# Patient Record
Sex: Male | Born: 1950 | Race: White | Hispanic: No
Health system: Southern US, Community
[De-identification: ages and names within clinical notes are randomized; demographics above are authoritative.]

---

## 2018-11-26 ENCOUNTER — Inpatient Hospital Stay
Admission: AD | Admit: 2018-11-26 | Discharge: 2018-12-29 | Disposition: A | Discharge status: Skilled Nursing Facility | Payer: Medicare HMO | Source: Other Acute Inpatient Hospital | Attending: Internal Medicine | Admitting: Internal Medicine

## 2018-11-26 DIAGNOSIS — K819 Cholecystitis, unspecified: Secondary | ICD-10-CM

## 2018-11-26 DIAGNOSIS — J9690 Respiratory failure, unspecified, unspecified whether with hypoxia or hypercapnia: Secondary | ICD-10-CM

## 2018-11-27 LAB — CBC WITH DIFFERENTIAL/PLATELET
Abs Immature Granulocytes: 0.03 10*3/uL (ref 0.00–0.07)
Basophils Absolute: 0 10*3/uL (ref 0.0–0.1)
Basophils Relative: 0 %
Eosinophils Absolute: 0.3 10*3/uL (ref 0.0–0.5)
Eosinophils Relative: 5 %
HCT: 32.9 % — ABNORMAL LOW (ref 39.0–52.0)
Hemoglobin: 10.1 g/dL — ABNORMAL LOW (ref 13.0–17.0)
Immature Granulocytes: 0 %
Lymphocytes Relative: 13 %
Lymphs Abs: 0.9 10*3/uL (ref 0.7–4.0)
MCH: 27.6 pg (ref 26.0–34.0)
MCHC: 30.7 g/dL (ref 30.0–36.0)
MCV: 89.9 fL (ref 80.0–100.0)
Monocytes Absolute: 0.7 10*3/uL (ref 0.1–1.0)
Monocytes Relative: 10 %
Neutro Abs: 5.1 10*3/uL (ref 1.7–7.7)
Neutrophils Relative %: 72 %
Platelets: 352 10*3/uL (ref 150–400)
RBC: 3.66 MIL/uL — ABNORMAL LOW (ref 4.22–5.81)
RDW: 16.7 % — ABNORMAL HIGH (ref 11.5–15.5)
WBC: 7 10*3/uL (ref 4.0–10.5)
nRBC: 0 % (ref 0.0–0.2)

## 2018-11-27 LAB — COMPREHENSIVE METABOLIC PANEL
ALT: 29 U/L (ref 0–44)
AST: 31 U/L (ref 15–41)
Albumin: 2.7 g/dL — ABNORMAL LOW (ref 3.5–5.0)
Alkaline Phosphatase: 136 U/L — ABNORMAL HIGH (ref 38–126)
Anion gap: 14 (ref 5–15)
BUN: 24 mg/dL — ABNORMAL HIGH (ref 8–23)
CO2: 19 mmol/L — ABNORMAL LOW (ref 22–32)
Calcium: 9.7 mg/dL (ref 8.9–10.3)
Chloride: 104 mmol/L (ref 98–111)
Creatinine, Ser: 1.95 mg/dL — ABNORMAL HIGH (ref 0.61–1.24)
GFR calc Af Amer: 40 mL/min — ABNORMAL LOW (ref >60)
GFR calc non Af Amer: 35 mL/min — ABNORMAL LOW (ref >60)
Glucose, Bld: 122 mg/dL — ABNORMAL HIGH (ref 70–99)
Potassium: 3.8 mmol/L (ref 3.5–5.1)
Sodium: 137 mmol/L (ref 135–145)
Total Bilirubin: 0.7 mg/dL (ref 0.3–1.2)
Total Protein: 7.4 g/dL (ref 6.5–8.1)

## 2018-11-29 LAB — BASIC METABOLIC PANEL
Anion gap: 13 (ref 5–15)
BUN: 36 mg/dL — ABNORMAL HIGH (ref 8–23)
CO2: 17 mmol/L — ABNORMAL LOW (ref 22–32)
Calcium: 9.9 mg/dL (ref 8.9–10.3)
Chloride: 104 mmol/L (ref 98–111)
Creatinine, Ser: 2.26 mg/dL — ABNORMAL HIGH (ref 0.61–1.24)
GFR calc Af Amer: 34 mL/min — ABNORMAL LOW (ref >60)
GFR calc non Af Amer: 29 mL/min — ABNORMAL LOW (ref >60)
Glucose, Bld: 122 mg/dL — ABNORMAL HIGH (ref 70–99)
Potassium: 4.4 mmol/L (ref 3.5–5.1)
Sodium: 134 mmol/L — ABNORMAL LOW (ref 135–145)

## 2018-11-30 LAB — BASIC METABOLIC PANEL
Anion gap: 9 (ref 5–15)
BUN: 32 mg/dL — ABNORMAL HIGH (ref 8–23)
CO2: 20 mmol/L — ABNORMAL LOW (ref 22–32)
Calcium: 9.5 mg/dL (ref 8.9–10.3)
Chloride: 107 mmol/L (ref 98–111)
Creatinine, Ser: 1.91 mg/dL — ABNORMAL HIGH (ref 0.61–1.24)
GFR calc Af Amer: 41 mL/min — ABNORMAL LOW (ref >60)
GFR calc non Af Amer: 36 mL/min — ABNORMAL LOW (ref >60)
Glucose, Bld: 116 mg/dL — ABNORMAL HIGH (ref 70–99)
Potassium: 4.1 mmol/L (ref 3.5–5.1)
Sodium: 136 mmol/L (ref 135–145)

## 2018-12-01 LAB — BASIC METABOLIC PANEL
Anion gap: 10 (ref 5–15)
BUN: 35 mg/dL — ABNORMAL HIGH (ref 8–23)
CO2: 18 mmol/L — ABNORMAL LOW (ref 22–32)
Calcium: 9.5 mg/dL (ref 8.9–10.3)
Chloride: 108 mmol/L (ref 98–111)
Creatinine, Ser: 1.71 mg/dL — ABNORMAL HIGH (ref 0.61–1.24)
GFR calc Af Amer: 47 mL/min — ABNORMAL LOW (ref >60)
GFR calc non Af Amer: 41 mL/min — ABNORMAL LOW (ref >60)
Glucose, Bld: 120 mg/dL — ABNORMAL HIGH (ref 70–99)
Potassium: 4.1 mmol/L (ref 3.5–5.1)
Sodium: 136 mmol/L (ref 135–145)

## 2018-12-02 LAB — BASIC METABOLIC PANEL
Anion gap: 12 (ref 5–15)
BUN: 27 mg/dL — ABNORMAL HIGH (ref 8–23)
CO2: 17 mmol/L — ABNORMAL LOW (ref 22–32)
Calcium: 9.7 mg/dL (ref 8.9–10.3)
Chloride: 105 mmol/L (ref 98–111)
Creatinine, Ser: 1.55 mg/dL — ABNORMAL HIGH (ref 0.61–1.24)
GFR calc Af Amer: 53 mL/min — ABNORMAL LOW (ref >60)
GFR calc non Af Amer: 46 mL/min — ABNORMAL LOW (ref >60)
Glucose, Bld: 129 mg/dL — ABNORMAL HIGH (ref 70–99)
Potassium: 5 mmol/L (ref 3.5–5.1)
Sodium: 134 mmol/L — ABNORMAL LOW (ref 135–145)

## 2018-12-04 LAB — CBC
HCT: 32.4 % — ABNORMAL LOW (ref 39.0–52.0)
Hemoglobin: 9.9 g/dL — ABNORMAL LOW (ref 13.0–17.0)
MCH: 26.6 pg (ref 26.0–34.0)
MCHC: 30.6 g/dL (ref 30.0–36.0)
MCV: 87.1 fL (ref 80.0–100.0)
Platelets: 351 10*3/uL (ref 150–400)
RBC: 3.72 MIL/uL — ABNORMAL LOW (ref 4.22–5.81)
RDW: 17 % — ABNORMAL HIGH (ref 11.5–15.5)
WBC: 10.5 10*3/uL (ref 4.0–10.5)
nRBC: 0 % (ref 0.0–0.2)

## 2018-12-04 LAB — BASIC METABOLIC PANEL
Anion gap: 10 (ref 5–15)
BUN: 21 mg/dL (ref 8–23)
CO2: 21 mmol/L — ABNORMAL LOW (ref 22–32)
Calcium: 9.3 mg/dL (ref 8.9–10.3)
Chloride: 100 mmol/L (ref 98–111)
Creatinine, Ser: 1.43 mg/dL — ABNORMAL HIGH (ref 0.61–1.24)
GFR calc Af Amer: 59 mL/min — ABNORMAL LOW (ref >60)
GFR calc non Af Amer: 51 mL/min — ABNORMAL LOW (ref >60)
Glucose, Bld: 153 mg/dL — ABNORMAL HIGH (ref 70–99)
Potassium: 3.8 mmol/L (ref 3.5–5.1)
Sodium: 131 mmol/L — ABNORMAL LOW (ref 135–145)

## 2018-12-05 ENCOUNTER — Other Ambulatory Visit (HOSPITAL_COMMUNITY): Payer: Medicare HMO

## 2018-12-05 MED ORDER — IOHEXOL 300 MG/ML  SOLN
125.0000 mL | Freq: Once | INTRAMUSCULAR | Status: AC | PRN
  Administered 2018-12-05: 125 mL via INTRAVENOUS

## 2018-12-07 ENCOUNTER — Other Ambulatory Visit (HOSPITAL_COMMUNITY): Payer: Medicare HMO

## 2018-12-07 MED ORDER — MORPHINE SULFATE (PF) 4 MG/ML IV SOLN
3.0000 mg | Freq: Once | INTRAVENOUS | Status: AC
  Administered 2018-12-07: 3 mg via INTRAVENOUS

## 2018-12-07 MED ORDER — MORPHINE SULFATE (PF) 4 MG/ML IV SOLN
INTRAVENOUS | Status: AC
  Filled 2018-12-07: qty 1

## 2018-12-07 MED ORDER — MORPHINE BOLUS VIA INFUSION: 3.0000 mg | Freq: Once | INTRAVENOUS | Status: DC

## 2018-12-08 ENCOUNTER — Other Ambulatory Visit (HOSPITAL_COMMUNITY): Payer: Medicare HMO

## 2018-12-08 LAB — COMPREHENSIVE METABOLIC PANEL
ALT: 15 U/L (ref 0–44)
AST: 16 U/L (ref 15–41)
Albumin: 1.9 g/dL — ABNORMAL LOW (ref 3.5–5.0)
Alkaline Phosphatase: 150 U/L — ABNORMAL HIGH (ref 38–126)
Anion gap: 19 — ABNORMAL HIGH (ref 5–15)
BUN: 56 mg/dL — ABNORMAL HIGH (ref 8–23)
CO2: 17 mmol/L — ABNORMAL LOW (ref 22–32)
Calcium: 9.3 mg/dL (ref 8.9–10.3)
Chloride: 95 mmol/L — ABNORMAL LOW (ref 98–111)
Creatinine, Ser: 3.84 mg/dL — ABNORMAL HIGH (ref 0.61–1.24)
GFR calc Af Amer: 18 mL/min — ABNORMAL LOW (ref >60)
GFR calc non Af Amer: 15 mL/min — ABNORMAL LOW (ref >60)
Glucose, Bld: 167 mg/dL — ABNORMAL HIGH (ref 70–99)
Potassium: 4.5 mmol/L (ref 3.5–5.1)
Sodium: 131 mmol/L — ABNORMAL LOW (ref 135–145)
Total Bilirubin: 0.9 mg/dL (ref 0.3–1.2)
Total Protein: 7.6 g/dL (ref 6.5–8.1)

## 2018-12-08 LAB — CBC
HCT: 31.9 % — ABNORMAL LOW (ref 39.0–52.0)
Hemoglobin: 9.9 g/dL — ABNORMAL LOW (ref 13.0–17.0)
MCH: 26.6 pg (ref 26.0–34.0)
MCHC: 31 g/dL (ref 30.0–36.0)
MCV: 85.8 fL (ref 80.0–100.0)
Platelets: 463 10*3/uL — ABNORMAL HIGH (ref 150–400)
RBC: 3.72 MIL/uL — ABNORMAL LOW (ref 4.22–5.81)
RDW: 16.7 % — ABNORMAL HIGH (ref 11.5–15.5)
WBC: 14.3 10*3/uL — ABNORMAL HIGH (ref 4.0–10.5)
nRBC: 0 % (ref 0.0–0.2)

## 2018-12-08 LAB — CK TOTAL AND CKMB (NOT AT ARMC)
CK, MB: 0.8 ng/mL (ref 0.5–5.0)
Relative Index: INVALID (ref 0.0–2.5)
Total CK: 26 U/L — ABNORMAL LOW (ref 49–397)

## 2018-12-08 LAB — TROPONIN I (HIGH SENSITIVITY): Troponin I (High Sensitivity): 18 ng/L — ABNORMAL HIGH (ref <18)

## 2018-12-08 LAB — TYPE AND SCREEN
ABO/RH(D): O POS
Antibody Screen: NEGATIVE

## 2018-12-08 LAB — PHOSPHORUS: Phosphorus: 6.3 mg/dL — ABNORMAL HIGH (ref 2.5–4.6)

## 2018-12-08 LAB — T4, FREE: Free T4: 0.97 ng/dL (ref 0.61–1.12)

## 2018-12-08 LAB — MAGNESIUM: Magnesium: 2 mg/dL (ref 1.7–2.4)

## 2018-12-08 LAB — TSH: TSH: 14.703 u[IU]/mL — ABNORMAL HIGH (ref 0.350–4.500)

## 2018-12-08 LAB — ABO/RH: ABO/RH(D): O POS

## 2018-12-08 LAB — BRAIN NATRIURETIC PEPTIDE: B Natriuretic Peptide: 113.7 pg/mL — ABNORMAL HIGH (ref 0.0–100.0)

## 2018-12-08 NOTE — Consult Note (Signed)
Chief Complaint: Patient was seen in consultation today for cholecystitis/cholecystostomy tube placement.  Referring Physician(s): Merton Border  Supervising Physician: Aletta Edouard  Patient Status: [facility]- inpt  History of Present Illness: Brent Newman is a 68 y.o. male with a past medical history of COPD, GERD, CKD stage III, recurrent diverticulitis with bowel perforation and abscess formation s/p exploratory laparotomy/washout/right hemicolectomy 09/2018,  hypothyroidism, anemia of chronic disease, chronic pain syndrome, morbid obesity, and tobacco use disorder. He presented to [facility] ED in 09/2018 (unable to find exact date in records) with complaint of abdominal pain and dyspnea. He was transferred/admitted to [facility] 10/06/2018 for further management. Imaging scans revealed free intraperitoneal air/multiple fluid collections. He was taken to OR for exploratory laparotomy and was found to have a large colon perforation with frank stool throughout his abdomen, he also underwent washout/right hemicolectomy at that time. 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 He was extubated 10/20/2018. He was transferred to [facility] on 11/26/2018. On 12/05/2018, patient noted to have tmax at 100.1, no complaints. CT abdomen/pelvis and RUQ [place] were done which revealed a distended gallbladder with wall thickening, sludge, and pericholecystic stranding/cholelithiasis. HIDA 12/07/2018 revealed findings consistent with acute cholecystitis. General surgery was consulted who states patient is not a surgical candidate given PMH, and recommended IR consultation for possible cholecystostomy tube placement.  IR consulted by Dr. Laren Everts for possible image-guided  percutaneous cholecystostomy tube placement. Patient awake and alert laying in bed. Complains of abdominal pain, stable since admission. Denies fever, chills, chest pain, dyspnea, or headache.   No past medical history on file.   Allergies: Patient has no allergy information on record.  Medications: Prior to Admission medications   Not on File     No family history on file.  Social History   Socioeconomic History   Marital status: Divorced    Spouse name: Not on file   Number of children: Not on file   Years of education: Not on file   Highest education level: Not on file  Occupational History   Not on file  Social Needs   Financial resource strain: Not on file   Food insecurity    Worry: Not on file    Inability: Not on file   Transportation needs    Medical: Not on file    Non-medical: Not on file  Tobacco Use   Smoking status: Not on file  Substance and Sexual Activity   Alcohol use: Not on file   Drug use: Not on file   Sexual activity: Not on file  Lifestyle   Physical activity    Days per week: Not on file    Minutes per session: Not on file   Stress: Not on file  Relationships   Social connections    Talks on phone: Not on file    Gets together: Not on file    Attends religious service: Not on file    Active member of club or organization: Not on file    Attends meetings of clubs or organizations: Not on file    Relationship status: Not on file  Other Topics Concern   Not on file  Social History Narrative   Not on file     Review of Systems: A 12 point ROS discussed and pertinent positives are indicated in  the HPI above.  All other systems are negative.  Review of Systems  Constitutional: Negative for chills and fever.  Respiratory: Negative for shortness of breath and wheezing.   Cardiovascular: Negative for chest pain and palpitations.  Gastrointestinal: Positive for abdominal pain.  Neurological: Negative for headaches.    Psychiatric/Behavioral: Negative for behavioral problems and confusion.    Vital Signs: There were no vitals taken for this visit.  Physical Exam Vitals signs and nursing note reviewed.  Constitutional:      General: He is not in acute distress.    Appearance: Normal appearance.  Cardiovascular:     Rate and Rhythm: Regular rhythm. Tachycardia present.     Heart sounds: Normal heart sounds. No murmur.  Pulmonary:     Effort: Pulmonary effort is normal. No respiratory distress.     Breath sounds: Normal breath sounds. No wheezing.  Skin:    General: Skin is warm and dry.  Neurological:     Mental Status: He is alert and oriented to person, place, and time.      MD Evaluation Airway: WNL Heart: WNL Abdomen: WNL Chest/ Lungs: WNL ASA  Classification: 3 Mallampati/Airway Score: Two   Imaging: Ct Abdomen Pelvis W Contrast  Addendum Date: 12/06/2018   ADDENDUM REPORT: 12/06/2018 15:28 ADDENDUM: Additional body imaging review requested. Acute sigmoid diverticulitis. Associated microperforation with tiny foci of pericolonic gas (series 3/image 70). Acute cholecystitis. Tiny layering gallstones (series 3/image 34). Additional tiny gallstone in the gallbladder neck (coronal image 51). No intrahepatic or extrahepatic ductal dilatation. No choledocholithiasis is seen (coronal image 48). Status post right hemicolectomy. Fluid/gas immediately adjacent to the suture line in the right mid abdomen (coronal images 21 and 23), raising concern for anastomotic breakdown. Scattered fluid and gas collections in the anterior/mid abdomen, worrisome for small abscesses given the clinical history. Dominant collection beneath the right mid anterior abdominal wall measures at least 3.0 x 7.6 x 9.8 cm (series 3/image 41; coronal image 38) and is adjacent to the gallbladder, possibly accounting for the right upper quadrant infection/inflammation. Additional collections include: --Fluid/gas beneath the  anterior abdominal wall (series 3/image 35) --2.6 cm fluid/gas collection beneath the midline anterior abdominal wall adjacent to the ileostomy (series 3/image 59) --2.0 cm fluid and gas collection is adjacent to small bowel in the anterior mid/lower abdomen (series 3/image 69) Surgical consultation is suggested. Electronically Signed   By: Charline Bills M.D.   On: 12/06/2018 15:28   Result Date: 12/06/2018 CLINICAL DATA:  Abdominal pain. Fever of unknown origin. EXAM: CT ABDOMEN AND PELVIS WITH CONTRAST TECHNIQUE: Multidetector CT imaging of the abdomen and pelvis was performed using the standard protocol following bolus administration of intravenous contrast. CONTRAST:  OMNIPAQUE IOHEXOL 300 MG/ML  SOLN COMPARISON:  None. FINDINGS: Lower chest: Right pleural thickening with right lower lobe atelectasis. There is lipomatous hypertrophy of the intra-atrial septum. Hepatobiliary: Gallbladder is distended with wall thickening and pericholecystic stranding. No calcified gallstone. No biliary dilatation. Mild hepatic steatosis without focal hepatic lesion. Pancreas: No ductal dilatation or inflammation. Spleen: Normal in size without focal abnormality. Adrenals/Urinary Tract: No adrenal nodule. Bilateral nonobstructing renal calculi. No hydronephrosis. Slight perinephric edema about the lower right kidney. Parapelvic cyst in the central upper left kidney. Absent excretion on delayed phase imaging consistent suggests renal dysfunction. Urinary bladder near completely empty. Layering hyperdensities in the dependent bladder and right urinary trigone suspicious for bladder stones. Stomach/Bowel: Bowel evaluation is limited in the absence of enteric contrast. There are right and left  lower quadrant enterostomies. Detailed bowel anatomy is not well-defined. Presumed prior right hemicolectomy. Stapled off transverse colon with diverticulosis distally. Area of colonic wall thickening and pericolonic edema in the  sigmoid in the region of multiple colonic diverticula series 3, image 73. Vascular/Lymphatic: Aortic atherosclerosis. No aneurysm. No abdominopelvic adenopathy. Reproductive: Prostate is unremarkable. Other: Postsurgical changes the anterior abdominal wall with multiple subcutaneous defects extending to the anterior peritoneum. Thin fluid collection with mild peripheral enhancement and focus of internal air in the right lower quadrant subjacent to the right abdominal wall measures approximately 5.3 x 10 mm, series 3, image 49. Air-fluid tract in the right upper quadrant is irregular in shape making measurements difficult, however tracks from images 35-42, measuring up to 5.6 cm in dimension. This approaches the transverse colon staple line. Musculoskeletal: There are no acute or suspicious osseous abnormalities. IMPRESSION: 1. Distended gallbladder with wall thickening and pericholecystic stranding, suspicious for acute cholecystitis. Consider right upper quadrant ultrasound for further evaluation. 2. Colonic wall thickening and mild pericolonic edema about the sigmoid colon in the region of multiple diverticula, may represent colitis or diverticulitis. 3. Recent postsurgical change with open anterior abdominal wall and bilateral lower quadrant enterostomies. Two thin irregularly-shaped separate fluid collections in the right abdomen as described, sterility indeterminate by imaging. Recommend correlation with surgical history. 4. Bilateral nonobstructing renal calculi. Absent renal excretion on delayed phase imaging suggesting underlying renal dysfunction. Aortic Atherosclerosis (ICD10-I70.0). Electronically Signed: By: Narda RutherfordMelanie  Sanford M.D. On: 12/05/2018 03:47   Nm Hepato W/eject Fract  Result Date: 12/07/2018 CLINICAL DATA:  Abdominal pain and fever.  Cholecystitis. EXAM: NUCLEAR MEDICINE HEPATOBILIARY IMAGING TECHNIQUE: Sequential images of the abdomen were obtained out to 60 minutes following intravenous  administration of radiopharmaceutical. 3 mg morphine was administered intravenously, and imaging was continued for another 50 minutes. RADIOPHARMACEUTICALS:  5.2 mCi Tc-7256m  Choletec IV COMPARISON:  None. FINDINGS: Prompt uptake and biliary excretion of activity by the liver is seen. Biliary activity passes into small bowel, consistent with patent common bile duct. No gallbladder activity is seen in either before or following administration of intravenous morphine, consistent with cystic duct obstruction. IMPRESSION: Absence of gallbladder activity demonstrates cystic duct obstruction, and is consistent with acute cholecystitis. No evidence of biliary obstruction. Electronically Signed   By: Danae OrleansJohn A Stahl M.D.   On: 12/07/2018 18:39   Dg Chest Port 1 View  Result Date: 12/08/2018 CLINICAL DATA:  Respiratory failure, shortness of breath EXAM: PORTABLE CHEST 1 VIEW COMPARISON:  None. FINDINGS: Cardiomegaly. There may be small, layering pleural effusions. The visualized skeletal structures are unremarkable. IMPRESSION: 1.  Cardiomegaly. 2. There may be small, layering pleural effusions. PA and lateral radiographs may be helpful to further evaluate. Electronically Signed   By: Lauralyn PrimesAlex  Bibbey M.D.   On: 12/08/2018 08:43   [place] Abdomen Limited Ruq  Result Date: 12/05/2018 CLINICAL DATA:  68 year old male with abdominal pain and fever. EXAM: ULTRASOUND ABDOMEN LIMITED RIGHT UPPER QUADRANT COMPARISON:  None. FINDINGS: Gallbladder: The gallbladder is distended and filled with sludge and multiple small stones. Mild gallbladder wall thickening is noted. No definite sonographic Murphy sign or pericholecystic fluid noted. Common bile duct: Diameter: 2.3 mm. No evidence of intrahepatic or extrahepatic biliary dilatation. Liver: No focal lesion identified. Within normal limits in parenchymal echogenicity. Portal vein is patent on color Doppler imaging with normal direction of blood flow towards the liver. Other: None.  IMPRESSION: 1. Cholelithiasis and gallbladder sludge with mild gallbladder wall thickening, suspicious for acute cholecystitis. 2. Unremarkable liver.  No biliary dilatation. Electronically Signed   By: Harmon Pier M.D.   On: 12/05/2018 20:01    Labs:  CBC: Recent Labs    11/27/18 0555 12/04/18 0527 12/08/18 0750  WBC 7.0 10.5 14.3*  HGB 10.1* 9.9* 9.9*  HCT 32.9* 32.4* 31.9*  PLT 352 351 463*    COAGS: No results for input(s): INR, APTT in the last 8760 hours.  BMP: Recent Labs    12/01/18 0536 12/02/18 0646 12/04/18 0527 12/08/18 0750  NA 136 134* 131* 131*  K 4.1 5.0 3.8 4.5  CL 108 105 100 95*  CO2 18* 17* 21* 17*  GLUCOSE 120* 129* 153* 167*  BUN 35* 27* 21 56*  CALCIUM 9.5 9.7 9.3 9.3  CREATININE 1.71* 1.55* 1.43* 3.84*  GFRNONAA 41* 46* 51* 15*  GFRAA 47* 53* 59* 18*    LIVER FUNCTION TESTS: Recent Labs    11/27/18 0555 12/08/18 0750  BILITOT 0.7 0.9  AST 31 16  ALT 29 15  ALKPHOS 136* 150*  PROT 7.4 7.6  ALBUMIN 2.7* 1.9*     Assessment and Plan:  Cholecystitis. Plan for image-guided percutaneous cholecystostomy tube placement tentatively for tomorrow 12/09/2018 in IR. Patient will be NPO at midnight. Afebrile. He does not take blood thinners. INR pending for 0500 tomorrow.  Risks and benefits were discussed with the patient including, but not limited to, bleeding, infection, gallbladder perforation, bile leak, sepsis or even death. All of the patient's questions were answered, patient is agreeable to proceed. Consent signed and in IR control room.   Thank you for this interesting consult.  I greatly enjoyed meeting Brent Newman and look forward to participating in their care.  A copy of this report was sent to the requesting provider on this date.  Electronically Signed: Elwin Mocha, PA-C 12/08/2018, 11:25 AM   I spent a total of 40 Minutes in face to face in clinical consultation, greater than 50% of which was  counseling/coordinating care for cholecystitis/cholecystostomy tube placement.

## 2018-12-08 NOTE — Consult Note (Signed)
Referring Physician: Dr. Priya/Dr. Lovett Sox Brent Newman is an 68 y.o. male.                       Chief Complaint: Palpitation  HPI: 68 years old male with PMH of respiratory failure, COPD, GERD, Hypothyroidism, Morbid obesity, CKD 3, anemia of chronic disease, tobacco use disorder and s/p perforation of bowel with abscess had right hemi-colectomy with chronic open abdominal wound with wound vac has heart rate in 190's for 1 hour. Patient c/o chest discomfort. Carotid massage did not change the rhythm. IV metoprolol 2.5 mg. IV x 2 improved heart rate from SVT to sinus rhythm 90's/min.   Past medical history : Positive type 2 DM, No HTN, + tobacco use disorder, + COPD, + hypothyroidism, CKD, III.   PSH: Appendectomy, Bilateral wrist ganglion cyst excision, Hemorroidectomy, Planter wart excision, Bilateral shoulder rotator cuff repair Stomach ulcer surgery and Tonsillectomy. Right hemicolectomy with open wound and wound vac in 09/2018.Marland Kitchen   The histories are not reviewed yet. Please review them in the "History" navigator section and refresh this SmartLink.  Family history : HTN to mother, Arthritis to mother and stroke in father.  Social History:  Divorced, every day smoker and 1 can of beer intake per week.  Allergies: Gabapentin  No medications prior to admission.    No results found for this or any previous visit (from the past 48 hour(s)). Nm Hepato W/eject Fract  Result Date: 12/07/2018 CLINICAL DATA:  Abdominal pain and fever.  Cholecystitis. EXAM: NUCLEAR MEDICINE HEPATOBILIARY IMAGING TECHNIQUE: Sequential images of the abdomen were obtained out to 60 minutes following intravenous administration of radiopharmaceutical. 3 mg morphine was administered intravenously, and imaging was continued for another 50 minutes. RADIOPHARMACEUTICALS:  5.2 mCi Tc-49m  Choletec IV COMPARISON:  None. FINDINGS: Prompt uptake and biliary excretion of activity by the liver is seen. Biliary activity passes  into small bowel, consistent with patent common bile duct. No gallbladder activity is seen in either before or following administration of intravenous morphine, consistent with cystic duct obstruction. IMPRESSION: Absence of gallbladder activity demonstrates cystic duct obstruction, and is consistent with acute cholecystitis. No evidence of biliary obstruction. Electronically Signed   By: Danae Orleans M.D.   On: 12/07/2018 18:39    Review Of Systems Constitutional: Positive fever, chills, weight loss or gain. Eyes: No vision change, wears glasses. No discharge or pain. Ears: No hearing loss, No tinnitus. Respiratory: Positive asthma, Positive COPD, pneumonias, shortness of breath. No hemoptysis. Cardiovascular: Positive chest pain, palpitation, leg edema. Gastrointestinal: Positive nausea, vomiting, diarrhea, no constipation. No GI bleed. No hepatitis. Genitourinary: No dysuria, hematuria, kidney stone. No incontinance. Neurological: No headache, stroke, seizures.  Psychiatry: No psych facility admission for anxiety, depression, suicide. No detox. Skin: No rash. Musculoskeletal: Positive joint pain, fibromyalgia, neck pain, back pain. Lymphadenopathy: No lymphadenopathy. Hematology: Positive anemia. No easy bruising.  P: 190 R: 18, BP: 90/40. There were no vitals taken for this visit. There is no height or weight on file to calculate BMI. General appearance: alert, cooperative, appears stated age and moderate distress Head: Normocephalic, atraumatic. Eyes: Blue eyes, pale pink conjunctiva, corneas clear.  Neck: No adenopathy, no carotid bruit, no JVD, supple, symmetrical, trachea midline and thyroid not enlarged. Resp: Clear to auscultation bilaterally. Cardio: Tachycardic, Regular rate and rhythm, S1, S2 normal, II/VI systolic murmur, no click, rub or gallop GI: Soft, tender; bowel sounds normal; large mid-line abdominal wound. Extremities: Trace edema, cyanosis or clubbing. Skin: Warm  and dry.  Neurologic: Alert and oriented X 1, normal strength..  Assessment/Plan Supra ventricular tachycardia-(SVT) COPD Asthma CKD, III Anemia of chronic diseasse Type 2 DM H/O HTN Tobacco use disorder S/P right hemicolectomy with open abdominal wound and wound vac  IV metoprolol 2.5 mg. X 2 over 2 minutes.  EKG post cardioversion Blood work. IV fluids.  Time spent: Review of old records, Lab, x-rays, EKG, other cardiac tests, examination, discussion with patient, PA over 70 minutes.  Birdie Riddle, MD  12/08/2018, 8:40 AM

## 2018-12-09 ENCOUNTER — Other Ambulatory Visit (HOSPITAL_COMMUNITY): Payer: Medicare HMO

## 2018-12-09 ENCOUNTER — Encounter (HOSPITAL_COMMUNITY): Payer: Self-pay | Admitting: Interventional Radiology

## 2018-12-09 HISTORY — PX: IR PERC CHOLECYSTOSTOMY: IMG2326

## 2018-12-09 LAB — GRAM STAIN

## 2018-12-09 LAB — BASIC METABOLIC PANEL
Anion gap: 15 (ref 5–15)
BUN: 56 mg/dL — ABNORMAL HIGH (ref 8–23)
CO2: 16 mmol/L — ABNORMAL LOW (ref 22–32)
Calcium: 9 mg/dL (ref 8.9–10.3)
Chloride: 102 mmol/L (ref 98–111)
Creatinine, Ser: 3.61 mg/dL — ABNORMAL HIGH (ref 0.61–1.24)
GFR calc Af Amer: 19 mL/min — ABNORMAL LOW (ref >60)
GFR calc non Af Amer: 17 mL/min — ABNORMAL LOW (ref >60)
Glucose, Bld: 111 mg/dL — ABNORMAL HIGH (ref 70–99)
Potassium: 4.9 mmol/L (ref 3.5–5.1)
Sodium: 133 mmol/L — ABNORMAL LOW (ref 135–145)

## 2018-12-09 LAB — PROTIME-INR
INR: 1.5 — ABNORMAL HIGH (ref 0.8–1.2)
Prothrombin Time: 18.3 seconds — ABNORMAL HIGH (ref 11.4–15.2)

## 2018-12-09 LAB — SARS CORONAVIRUS 2 (TAT 6-24 HRS): SARS Coronavirus 2: NEGATIVE

## 2018-12-09 MED ORDER — MIDAZOLAM HCL 2 MG/2ML IJ SOLN
INTRAMUSCULAR | Status: AC | PRN
  Administered 2018-12-09: 1 mg via INTRAVENOUS

## 2018-12-09 MED ORDER — IOHEXOL 300 MG/ML  SOLN
50.0000 mL | Freq: Once | INTRAMUSCULAR | Status: AC | PRN
  Administered 2018-12-09: 10 mL

## 2018-12-09 MED ORDER — HYDROMORPHONE HCL 1 MG/ML IJ SOLN
INTRAMUSCULAR | Status: AC | PRN
  Administered 2018-12-09 (×2): 0.5 mg via INTRAVENOUS

## 2018-12-09 MED ORDER — HYDROMORPHONE HCL 1 MG/ML IJ SOLN
INTRAMUSCULAR | Status: AC
  Filled 2018-12-09: qty 1

## 2018-12-09 MED ORDER — MIDAZOLAM HCL 2 MG/2ML IJ SOLN
INTRAMUSCULAR | Status: AC
  Filled 2018-12-09: qty 2

## 2018-12-09 MED ORDER — FENTANYL CITRATE (PF) 100 MCG/2ML IJ SOLN
INTRAMUSCULAR | Status: AC | PRN
  Administered 2018-12-09: 50 ug via INTRAVENOUS

## 2018-12-09 MED ORDER — FENTANYL CITRATE (PF) 100 MCG/2ML IJ SOLN
INTRAMUSCULAR | Status: AC
  Filled 2018-12-09: qty 2

## 2018-12-09 MED ORDER — LIDOCAINE HCL 1 % IJ SOLN
INTRAMUSCULAR | Status: AC
  Filled 2018-12-09: qty 20

## 2018-12-09 NOTE — Procedures (Signed)
Interventional Radiology Procedure Note  Procedure: Successful placement of a 78F percutaneous cholecystostomy tube.   Complications: None  Estimated Blood Loss: None  Recommendations: - Cultures sent - Flush tube Q shift - Tube check/change in 8 weeks  Signed,  Criselda Peaches, MD

## 2018-12-10 LAB — COMPREHENSIVE METABOLIC PANEL
ALT: 12 U/L (ref 0–44)
AST: 17 U/L (ref 15–41)
Albumin: 1.7 g/dL — ABNORMAL LOW (ref 3.5–5.0)
Alkaline Phosphatase: 119 U/L (ref 38–126)
Anion gap: 14 (ref 5–15)
BUN: 58 mg/dL — ABNORMAL HIGH (ref 8–23)
CO2: 16 mmol/L — ABNORMAL LOW (ref 22–32)
Calcium: 9.1 mg/dL (ref 8.9–10.3)
Chloride: 108 mmol/L (ref 98–111)
Creatinine, Ser: 4.04 mg/dL — ABNORMAL HIGH (ref 0.61–1.24)
GFR calc Af Amer: 17 mL/min — ABNORMAL LOW (ref >60)
GFR calc non Af Amer: 14 mL/min — ABNORMAL LOW (ref >60)
Glucose, Bld: 152 mg/dL — ABNORMAL HIGH (ref 70–99)
Potassium: 4 mmol/L (ref 3.5–5.1)
Sodium: 138 mmol/L (ref 135–145)
Total Bilirubin: 0.6 mg/dL (ref 0.3–1.2)
Total Protein: 7.1 g/dL (ref 6.5–8.1)

## 2018-12-10 LAB — CBC
HCT: 29.2 % — ABNORMAL LOW (ref 39.0–52.0)
Hemoglobin: 8.9 g/dL — ABNORMAL LOW (ref 13.0–17.0)
MCH: 26.3 pg (ref 26.0–34.0)
MCHC: 30.5 g/dL (ref 30.0–36.0)
MCV: 86.1 fL (ref 80.0–100.0)
Platelets: 461 10*3/uL — ABNORMAL HIGH (ref 150–400)
RBC: 3.39 MIL/uL — ABNORMAL LOW (ref 4.22–5.81)
RDW: 17 % — ABNORMAL HIGH (ref 11.5–15.5)
WBC: 10.4 10*3/uL (ref 4.0–10.5)
nRBC: 0 % (ref 0.0–0.2)

## 2018-12-11 NOTE — Progress Notes (Signed)
Referring Physician(s): Carron Curie  Supervising Physician: Simonne Come  Patient Status: [facility]- inpt  Chief Complaint: "Thirsty"  Subjective:  Acute cholecystitis s/p percutaneous cholecystostomy tube placement in IR 12/09/2018 by Dr. Archer Asa. Patient awake and alert laying in bed. States he is thirsty. Cholecystostomy tube site c/d/i.   Allergies: Patient has no allergy information on record.  Medications: Prior to Admission medications   Not on File     Vital Signs: BP 126/70    Pulse 91    Resp 17    SpO2 93%   Physical Exam Vitals signs and nursing note reviewed.  Constitutional:      General: He is not in acute distress.    Appearance: Normal appearance.  Pulmonary:     Effort: Pulmonary effort is normal. No respiratory distress.  Abdominal:     Comments: Cholecystostomy tube site without tenderness, erythema, drainage, or active bleeding; approximately 25 cc of dark brown bile in gravity bag.  Skin:    General: Skin is warm and dry.  Neurological:     Mental Status: He is alert.     Imaging: Nm Hepato W/eject Fract  Result Date: 12/07/2018 CLINICAL DATA:  Abdominal pain and fever.  Cholecystitis. EXAM: NUCLEAR MEDICINE HEPATOBILIARY IMAGING TECHNIQUE: Sequential images of the abdomen were obtained out to 60 minutes following intravenous administration of radiopharmaceutical. 3 mg morphine was administered intravenously, and imaging was continued for another 50 minutes. RADIOPHARMACEUTICALS:  5.2 mCi Tc-84m  Choletec IV COMPARISON:  None. FINDINGS: Prompt uptake and biliary excretion of activity by the liver is seen. Biliary activity passes into small bowel, consistent with patent common bile duct. No gallbladder activity is seen in either before or following administration of intravenous morphine, consistent with cystic duct obstruction. IMPRESSION: Absence of gallbladder activity demonstrates cystic duct obstruction, and is consistent with acute  cholecystitis. No evidence of biliary obstruction. Electronically Signed   By: Danae Orleans M.D.   On: 12/07/2018 18:39   Ir Perc Cholecystostomy  Result Date: 12/09/2018 INDICATION: 68 year old male with acute calculus cholecystitis. He is currently not an operative candidate and therefore presents for percutaneous cholecystostomy tube placement. EXAM: CHOLECYSTOSTOMY MEDICATIONS: Patient is currently admitted and receiving intravenous Zosyn. No additional antibiotic coverage administered. ANESTHESIA/SEDATION: Moderate (conscious) sedation was employed during this procedure. A total of Versed 1 mg and Fentanyl 50 mcg and 1 mg Dilaudid was administered intravenously. Moderate Sedation Time: 20 minutes. The patient's level of consciousness and vital signs were monitored continuously by radiology nursing throughout the procedure under my direct supervision. FLUOROSCOPY TIME:  Fluoroscopy Time: 1 minutes 6 seconds (9 mGy). COMPLICATIONS: None immediate. PROCEDURE: Informed written consent was obtained from the patient after a thorough discussion of the procedural risks, benefits and alternatives. All questions were addressed. Maximal Sterile Barrier Technique was utilized including caps, mask, sterile gowns, sterile gloves, sterile drape, hand hygiene and skin antiseptic. A timeout was performed prior to the initiation of the procedure. The right upper quadrant was interrogated with ultrasound. The distended gallbladder was successfully identified. The gallbladder lumen is filled with heterogeneously echogenic material and multiple echogenic foci some of which demonstrate ring down artifact. A suitable skin entry site was selected and marked. Local anesthesia was attained by infiltration with 1% lidocaine. A small dermatotomy was made. Under real-time sonographic guidance, a 21 gauge Accustick needle was advanced along a short transhepatic course and into the gallbladder lumen. The stylet was removed and a wire  was advanced into the gallbladder lumen. The needle was removed.  The Accustick sheath was advanced over the wire and into the gallbladder lumen. An injection of contrast material was performed confirming that the catheter is within the gallbladder. Aspiration was performed yielding approximately 100 cc of thick, dark brown bile and debris. The catheter was gently flushed and connected to gravity bag drainage. The catheter was then secured to the skin with 0 Prolene suture. A final image was obtained documenting the position of the drainage catheter for the medical record. IMPRESSION: Successful placement of a transhepatic percutaneous cholecystostomy tube for acute calculus cholecystitis. PLAN: 1. Return to interventional Radiology in 8 weeks for tube check/exchange. 2. Patient to follow with general surgery in the future to determine if he will be common operative candidate in the future. Signed, Criselda Peaches, MD, [facility] Electronically Signed   By: Jacqulynn Cadet M.D.   On: 12/09/2018 12:39   Dg Chest Port 1 View  Result Date: 12/08/2018 CLINICAL DATA:  Respiratory failure, shortness of breath EXAM: PORTABLE CHEST 1 VIEW COMPARISON:  None. FINDINGS: Cardiomegaly. There may be small, layering pleural effusions. The visualized skeletal structures are unremarkable. IMPRESSION: 1.  Cardiomegaly. 2. There may be small, layering pleural effusions. PA and lateral radiographs may be helpful to further evaluate. Electronically Signed   By: Eddie Candle M.D.   On: 12/08/2018 08:43    Labs:  CBC: Recent Labs    11/27/18 0555 12/04/18 0527 12/08/18 0750 12/10/18 0704  WBC 7.0 10.5 14.3* 10.4  HGB 10.1* 9.9* 9.9* 8.9*  HCT 32.9* 32.4* 31.9* 29.2*  PLT 352 351 463* 461*    COAGS: Recent Labs    12/09/18 0532  INR 1.5*    BMP: Recent Labs    12/04/18 0527 12/08/18 0750 12/09/18 0532 12/10/18 0704  NA 131* 131* 133*  138  K 3.8 4.5 4.9 4.0  CL 100 95* 102 108  CO2 21* 17* 16* 16*  GLUCOSE 153* 167* 111* 152*  BUN 21 56* 56* 58*  CALCIUM 9.3 9.3 9.0 9.1  CREATININE 1.43* 3.84* 3.61* 4.04*  GFRNONAA 51* 15* 17* 14*  GFRAA 59* 18* 19* 17*    LIVER FUNCTION TESTS: Recent Labs    11/27/18 0555 12/08/18 0750 12/10/18 0704  BILITOT 0.7 0.9 0.6  AST 31 16 17   ALT 29 15 12   ALKPHOS 136* 150* 119  PROT 7.4 7.6 7.1  ALBUMIN 2.7* 1.9* 1.7*    Assessment and Plan:  Acute cholecystitis s/p percutaneous cholecystostomy tube placement in IR 12/09/2018 by Dr. Laurence Ferrari. Cholecystostomy tube stable with approximately 25 cc dark brown bile in gravity bag. Continue current drain management. Plan for tube check/exchange 8 weeks after placement. Further plans per [facility]- appreciate and agree with management. IR to follow.   Electronically Signed: Earley Abide, PA-C 12/11/2018, 2:45 PM   I spent a total of 25 Minutes at the the patient's bedside AND on the patient's hospital floor or unit, greater than 50% of which was counseling/coordinating care for acute cholecystitis s/p cholecystostomy tube placement.

## 2018-12-12 LAB — BASIC METABOLIC PANEL
Anion gap: 14 (ref 5–15)
BUN: 50 mg/dL — ABNORMAL HIGH (ref 8–23)
CO2: 16 mmol/L — ABNORMAL LOW (ref 22–32)
Calcium: 9.4 mg/dL (ref 8.9–10.3)
Chloride: 110 mmol/L (ref 98–111)
Creatinine, Ser: 4.15 mg/dL — ABNORMAL HIGH (ref 0.61–1.24)
GFR calc Af Amer: 16 mL/min — ABNORMAL LOW (ref >60)
GFR calc non Af Amer: 14 mL/min — ABNORMAL LOW (ref >60)
Glucose, Bld: 87 mg/dL (ref 70–99)
Potassium: 3.5 mmol/L (ref 3.5–5.1)
Sodium: 140 mmol/L (ref 135–145)

## 2018-12-12 LAB — CULTURE, BODY FLUID W GRAM STAIN -BOTTLE

## 2018-12-14 LAB — VANCOMYCIN, RANDOM: Vancomycin Rm: 13

## 2018-12-16 LAB — CBC
HCT: 29.5 % — ABNORMAL LOW (ref 39.0–52.0)
Hemoglobin: 8.6 g/dL — ABNORMAL LOW (ref 13.0–17.0)
MCH: 26.1 pg (ref 26.0–34.0)
MCHC: 29.2 g/dL — ABNORMAL LOW (ref 30.0–36.0)
MCV: 89.4 fL (ref 80.0–100.0)
Platelets: 445 10*3/uL — ABNORMAL HIGH (ref 150–400)
RBC: 3.3 MIL/uL — ABNORMAL LOW (ref 4.22–5.81)
RDW: 17.7 % — ABNORMAL HIGH (ref 11.5–15.5)
WBC: 7.5 10*3/uL (ref 4.0–10.5)
nRBC: 0 % (ref 0.0–0.2)

## 2018-12-16 LAB — BASIC METABOLIC PANEL
Anion gap: 11 (ref 5–15)
BUN: 33 mg/dL — ABNORMAL HIGH (ref 8–23)
CO2: 16 mmol/L — ABNORMAL LOW (ref 22–32)
Calcium: 9.1 mg/dL (ref 8.9–10.3)
Chloride: 119 mmol/L — ABNORMAL HIGH (ref 98–111)
Creatinine, Ser: 3.09 mg/dL — ABNORMAL HIGH (ref 0.61–1.24)
GFR calc Af Amer: 23 mL/min — ABNORMAL LOW (ref >60)
GFR calc non Af Amer: 20 mL/min — ABNORMAL LOW (ref >60)
Glucose, Bld: 76 mg/dL (ref 70–99)
Potassium: 3.3 mmol/L — ABNORMAL LOW (ref 3.5–5.1)
Sodium: 146 mmol/L — ABNORMAL HIGH (ref 135–145)

## 2018-12-16 LAB — MAGNESIUM: Magnesium: 1.5 mg/dL — ABNORMAL LOW (ref 1.7–2.4)

## 2018-12-16 MED ORDER — Medication
100.00 | Status: DC
Start: 2018-11-26 — End: 2018-12-16

## 2018-12-16 MED ORDER — DAYHIST-1 PO
15.00 | ORAL | Status: DC
Start: 2018-11-26 — End: 2018-12-16

## 2018-12-16 MED ORDER — Medication
Status: DC
Start: ? — End: 2018-12-16

## 2018-12-16 MED ORDER — ACTIVE CATH MALE EXTERNAL MISC
4.00 | Status: DC
Start: 2018-11-26 — End: 2018-12-16

## 2018-12-16 MED ORDER — GENERIC EXTERNAL MEDICATION
0.08 | Status: DC
Start: ? — End: 2018-12-16

## 2018-12-16 MED ORDER — TGT EYE ALLERGY RELIEF 0.027-0.315 % OP SOLN
25.00 | OPHTHALMIC | Status: DC
Start: ? — End: 2018-12-16

## 2018-12-16 MED ORDER — PYRILAMINE TAN-PHENYLEPH TAN 30-5 MG/5ML PO SUSP
50.00 | ORAL | Status: DC
Start: 2018-11-26 — End: 2018-12-16

## 2018-12-16 MED ORDER — BAYER WOMENS 81-300 MG PO TABS
40.00 | ORAL_TABLET | ORAL | Status: DC
Start: 2018-11-26 — End: 2018-12-16

## 2018-12-16 MED ORDER — ONE-A-DAY WITHIN PO
30.00 | ORAL | Status: DC
Start: ? — End: 2018-12-16

## 2018-12-16 MED ORDER — OLANZAPINE-FLUOXETINE HCL 6-50 MG PO CAPS
3.00 | ORAL_CAPSULE | ORAL | Status: DC
Start: 2018-11-26 — End: 2018-12-16

## 2018-12-16 MED ORDER — SODIUM CHLORIDE 0.9 % IV SOLN
10.00 | INTRAVENOUS | Status: DC
Start: ? — End: 2018-12-16

## 2018-12-16 MED ORDER — NIGHT-TIME SLEEP AID PO
4.00 | ORAL | Status: DC
Start: 2018-11-26 — End: 2018-12-16

## 2018-12-16 NOTE — Progress Notes (Signed)
    Referring Physician(s): Hijazi  Supervising Physician: Markus Daft  Patient Status:  [facility]  Chief Complaint:  Cholecystitis   Subjective:  Perc chole drain placed 10/21 in IR Draining well OP bile Up in bed No complaints  Allergies: Patient has no allergy information on record.  Medications: Prior to Admission medications   Not on File     Vital Signs: BP 126/70   Pulse 91   Resp 17   SpO2 93%   Physical Exam Vitals signs reviewed.  Skin:    General: Skin is warm and dry.     Comments: Site is clean and dry NT no bleeding OP bile Flushing easily  Report Status 12/12/2018 FINAL  Organism ID, Bacteria ENTEROCOCCUS FAECIUM        Imaging: No results found.  Labs:  CBC: Recent Labs    12/04/18 0527 12/08/18 0750 12/10/18 0704 12/16/18 0619  WBC 10.5 14.3* 10.4 7.5  HGB 9.9* 9.9* 8.9* 8.6*  HCT 32.4* 31.9* 29.2* 29.5*  PLT 351 463* 461* 445*    COAGS: Recent Labs    12/09/18 0532  INR 1.5*    BMP: Recent Labs    12/09/18 0532 12/10/18 0704 12/12/18 0525 12/16/18 0619  NA 133* 138 140 146*  K 4.9 4.0 3.5 3.3*  CL 102 108 110 119*  CO2 16* 16* 16* 16*  GLUCOSE 111* 152* 87 76  BUN 56* 58* 50* 33*  CALCIUM 9.0 9.1 9.4 9.1  CREATININE 3.61* 4.04* 4.15* 3.09*  GFRNONAA 17* 14* 14* 20*  GFRAA 19* 17* 16* 23*    LIVER FUNCTION TESTS: Recent Labs    11/27/18 0555 12/08/18 0750 12/10/18 0704  BILITOT 0.7 0.9 0.6  AST 31 16 17   ALT 29 15 12   ALKPHOS 136* 150* 119  PROT 7.4 7.6 7.1  ALBUMIN 2.7* 1.9* 1.7*    Assessment and Plan:  Perc chole drain intact draining well Pt will need drain injection 8 weeks If home with drain: need flushed daily 5 cc sterile saline; need record OP daily Please let IR know if pt DCing with drain  Electronically Signed: Lavonia Drafts, PA-C 12/16/2018, 10:06 AM   I spent a total of 15 Minutes at the the patient's bedside AND on the patient's hospital floor or unit, greater than  50% of which was counseling/coordinating care for perc chole drain

## 2018-12-16 NOTE — Consult Note (Signed)
[facility] CONSULT NOTE    Date: 12/16/2018                  Patient Name:  Brent Newman  MRN: [id number]  DOB: Oct 13, 1950  Age / Sex: 68 y.o., male         PCP: Default, Provider, MD                 Service Requesting Consult: Hospitalist                 Reason for Consult: Acute renal failure            History of Present Illness: Patient is a 68 y.o. male with a PMHx of asthma, nicotine abuse, hypertension, BPH, morbid obesity, peripheral neuropathy, chronic pain syndrome, recurrent diverticulitis with colonic perforation status post right hemicolectomy and colostomy/ileostomy placement, cholecystectomy tube placement, chronic kidney disease stage III with acute kidney injury, who was admitted to [facility] 11/26/2018 for ongoing treatment of generalized debility, acute renal failure, malnutrition.  Patient had a very prolonged hospitalization from October 06, 2018 to November 26, 2018 at [facility].  As above he had history of diverticulitis with colonic perforation.  He underwent ileostomy placement with multiple abdominal washouts.  He also developed acute renal failure with a peak creatinine of 7 but did not require dialysis treatment.  Upon arrival here his creatinine was 1.43.  It rose to a peak of 4.15 but is now trending down and currently 3.09.   Medications: Outpatient medications: 0.9 normal saline, Humalog sliding scale insulin, cholestyramine 1 packet twice daily, Ensure 1 supplement at supper, Juven 1 packet twice daily, levothyroxine 0.5 mg daily, Lidoderm 1 patch daily, loperamide 4 mg 4 times daily, Lyrica 50 mg twice daily, melatonin 3 mg nightly, metoprolol 12.5 mg twice daily, mirtazapine 15 mg nightly, pantoprazole 40 mg twice daily, multivitamin 1 tablet daily, trazodone 50 mg nightly, vancomycin 1.5 g every 72 hours   Allergies: Gabapentin   Past Medical History: asthma, nicotine abuse, hypertension, BPH,  morbid obesity, peripheral neuropathy, chronic pain syndrome, recurrent diverticulitis with colonic perforation status post right hemicolectomy and colostomy/ileostomy placement, cholecystectomy tube placement, chronic kidney disease stage III with acute kidney injury,  Past Surgical History: Right hemicolectomy with ileostomy creation Multiple abdominal washouts Cholecystostomy tube placement  Family History: No family history of ESRD  Social History: Prior history of tobacco use.  Review of Systems: Review of Systems  Constitutional: Positive for malaise/fatigue. Negative for chills and fever.  HENT: Negative for congestion, hearing loss and tinnitus.   Eyes: Negative for blurred vision and double vision.  Respiratory: Negative for cough, hemoptysis and shortness of breath.   Cardiovascular: Negative for chest pain, palpitations and orthopnea.  Gastrointestinal: Positive for abdominal pain. Negative for heartburn, nausea and vomiting.  Genitourinary: Negative for dysuria and urgency.  Musculoskeletal: Positive for joint pain.  Skin: Negative for itching and rash.  Neurological: Positive for weakness. Negative for dizziness.  Endo/Heme/Allergies: Negative for polydipsia. Does not bruise/bleed easily.  Psychiatric/Behavioral: Negative for depression. The patient is not nervous/anxious.      Vital Signs: Temperature 97.3 pulse 90 respirations 20 blood pressure 130/74 Weight trends: There were no vitals filed for this visit.  Physical Exam: General: Chronically ill-appearing  Head: Normocephalic, atraumatic.  Eyes: Anicteric, EOMI  Nose: Mucous membranes moist, not inflammed, nonerythematous.  Throat: Oropharynx nonerythematous, no exudate appreciated.   Neck: Supple, trachea midline.  Lungs:  Normal respiratory effort. Clear  to auscultation BL without crackles or wheezes.  Heart: S1S2 no rubs  Abdomen:  Ileostomy noted, cholecystostomy tube noted  Extremities: Trace lower  extremity edema  Neurologic: A&O X3, Motor strength is 5/5 in the all 4 extremities  Skin: No visible rashes    Lab results: Basic Metabolic Panel: Recent Labs  Lab 12/10/18 0704 12/12/18 0525 12/16/18 0619  NA 138 140 146*  K 4.0 3.5 3.3*  CL 108 110 119*  CO2 16* 16* 16*  GLUCOSE 152* 87 76  BUN 58* 50* 33*  CREATININE 4.04* 4.15* 3.09*  CALCIUM 9.1 9.4 9.1  MG  --   --  1.5*    Liver Function Tests: Recent Labs  Lab 12/10/18 0704  AST 17  ALT 12  ALKPHOS 119  BILITOT 0.6  PROT 7.1  ALBUMIN 1.7*   No results for input(s): LIPASE, AMYLASE in the last 168 hours. No results for input(s): AMMONIA in the last 168 hours.  CBC: Recent Labs  Lab 12/10/18 0704 12/16/18 0619  WBC 10.4 7.5  HGB 8.9* 8.6*  HCT 29.2* 29.5*  MCV 86.1 89.4  PLT 461* 445*    Cardiac Enzymes: No results for input(s): CKTOTAL, CKMB, CKMBINDEX, TROPONINI in the last 168 hours.  BNP: Invalid input(s): POCBNP  CBG: No results for input(s): GLUCAP in the last 168 hours.  Microbiology: Results for orders placed or performed during the hospital encounter of 11/26/18  SARS CORONAVIRUS 2 (TAT 6-24 HRS) Nasopharyngeal Nasopharyngeal Swab     Status: None   Collection Time: 12/08/18 10:05 PM   Specimen: Nasopharyngeal Swab  Result Value Ref Range Status   SARS Coronavirus 2 NEGATIVE NEGATIVE Final    Comment: (NOTE) SARS-CoV-2 target nucleic acids are NOT DETECTED. The SARS-CoV-2 RNA is generally detectable in upper and lower respiratory specimens during the acute phase of infection. Negative results do not preclude SARS-CoV-2 infection, do not rule out co-infections with other pathogens, and should not be used as the sole basis for treatment or other patient management decisions. Negative results must be combined with clinical observations, patient history, and epidemiological information. The expected result is Negative. Fact Sheet for  Patients: SugarRoll.be Fact Sheet for Healthcare Providers: https://www.woods-mathews.com/ This test is not yet approved or cleared by the [place] FDA and  has been authorized for detection and/or diagnosis of SARS-CoV-2 by FDA under an Emergency Use Authorization (EUA). This EUA will remain  in effect (meaning this test can be used) for the duration of the COVID-19 declaration under Section 56 4(b)(1) of the Act, 21 U.S.C. section 360bbb-3(b)(1), unless the authorization is terminated or revoked sooner. Performed at [facility], [place] [address], [place], [place] [address]   Culture, body fluid-bottle     Status: Abnormal   Collection Time: 12/09/18 11:53 AM   Specimen: BILE  Result Value Ref Range Status   Specimen Description BILE  Final   Special Requests NONE  Final   Gram Stain   Final    IN BOTH AEROBIC AND ANAEROBIC BOTTLES GRAM POSITIVE COCCI Performed at [facility], [address] [place], [place] [address]    Culture ENTEROCOCCUS FAECIUM (A)  Final   Report Status 12/12/2018 FINAL  Final   Organism ID, Bacteria ENTEROCOCCUS FAECIUM  Final      Susceptibility   Enterococcus faecium - MIC*    AMPICILLIN >=32 RESISTANT Resistant     VANCOMYCIN 1 SENSITIVE Sensitive     GENTAMICIN SYNERGY SENSITIVE Sensitive     *  ENTEROCOCCUS FAECIUM  Gram stain     Status: None   Collection Time: 12/09/18 11:53 AM   Specimen: BILE  Result Value Ref Range Status   Specimen Description BILE  Final   Special Requests NONE  Final   Gram Stain   Final    RARE WBC PRESENT, PREDOMINANTLY MONONUCLEAR MODERATE GRAM POSITIVE COCCI IN PAIRS IN CHAINS Performed at [facility], [address], [place], [place] [address]    Report Status 12/09/2018 FINAL  Final    Coagulation Studies: No results for input(s): LABPROT, INR in the last 72 hours.  Urinalysis: No results for input(s): COLORURINE, LABSPEC, PHURINE, GLUCOSEU,  HGBUR, BILIRUBINUR, KETONESUR, PROTEINUR, UROBILINOGEN, NITRITE, LEUKOCYTESUR in the last 72 hours.  Invalid input(s): APPERANCEUR    Imaging:  No results found.   Assessment & Plan: Pt is a 68 y.o. male with a PMHx of asthma, nicotine abuse, hypertension, BPH, morbid obesity, peripheral neuropathy, chronic pain syndrome, recurrent diverticulitis with colonic perforation status post right hemicolectomy and colostomy/ileostomy placement, cholecystectomy tube placement, chronic kidney disease stage III with acute kidney injury, who was admitted to [facility]/2018 for ongoing treatment of generalized debility, acute renal failure, malnutrition.    1.  Acute renal failure/chronic kidney disease stage III a baseline EGFR 51.  Suspect acute renal failure related to concurrent illness and periods of dehydration as he has ileostomy in place.  Creatinine now trending down to 3.09.  Continue IV fluid hydration for now.  No indication for dialysis.  2.  Hypernatremia.  Serum sodium has risen to 146.  We will need to monitor this closely.  Repeat serum sodium on Friday.  3.  Metabolic acidosis.  Serum bicarbonate currently 16.  Likely related to ileostomy and GI output.  May need to consider adding sodium bicarbonate but hold off for now.  4.  Thanks for consultation.

## 2018-12-17 LAB — MAGNESIUM: Magnesium: 1.8 mg/dL (ref 1.7–2.4)

## 2018-12-17 LAB — POTASSIUM: Potassium: 2.9 mmol/L — ABNORMAL LOW (ref 3.5–5.1)

## 2018-12-18 LAB — BASIC METABOLIC PANEL
Anion gap: 12 (ref 5–15)
BUN: 26 mg/dL — ABNORMAL HIGH (ref 8–23)
CO2: 23 mmol/L (ref 22–32)
Calcium: 8.9 mg/dL (ref 8.9–10.3)
Chloride: 103 mmol/L (ref 98–111)
Creatinine, Ser: 2.49 mg/dL — ABNORMAL HIGH (ref 0.61–1.24)
GFR calc Af Amer: 30 mL/min — ABNORMAL LOW (ref >60)
GFR calc non Af Amer: 26 mL/min — ABNORMAL LOW (ref >60)
Glucose, Bld: 89 mg/dL (ref 70–99)
Potassium: 3.2 mmol/L — ABNORMAL LOW (ref 3.5–5.1)
Sodium: 138 mmol/L (ref 135–145)

## 2018-12-18 NOTE — Progress Notes (Signed)
[facility]  ROUNDING NOTE   Subjective:  Patient seen and evaluated at bedside today. Renal function has improved significantly. Creatinine down to 2.49. Potassium still a bit low at 3.2.   Objective:  Vital signs in last 24 hours:  Temperature 98.6 pulse 95 respirations 23 blood pressure 130/71  Physical Exam: General: No acute distress  Head: Normocephalic, atraumatic. Moist oral mucosal membranes  Eyes: Anicteric  Neck: Supple, trachea midline  Lungs:  Clear to auscultation, normal effort  Heart: S1S2 no rubs  Abdomen:  Soft, nontender, ileostomy and cholecystostomy tube present  Extremities: No peripheral edema.  Neurologic: Awake, alert, following commands  Skin: No lesions       Basic Metabolic Panel: Recent Labs  Lab 12/12/18 0525 12/16/18 0619 12/17/18 0857 12/18/18 0503  NA 140 146*  --  138  K 3.5 3.3* 2.9* 3.2*  CL 110 119*  --  103  CO2 16* 16*  --  23  GLUCOSE 87 76  --  89  BUN 50* 33*  --  26*  CREATININE 4.15* 3.09*  --  2.49*  CALCIUM 9.4 9.1  --  8.9  MG  --  1.5* 1.8  --     Liver Function Tests: No results for input(s): AST, ALT, ALKPHOS, BILITOT, PROT, ALBUMIN in the last 168 hours. No results for input(s): LIPASE, AMYLASE in the last 168 hours. No results for input(s): AMMONIA in the last 168 hours.  CBC: Recent Labs  Lab 12/16/18 0619  WBC 7.5  HGB 8.6*  HCT 29.5*  MCV 89.4  PLT 445*    Cardiac Enzymes: No results for input(s): CKTOTAL, CKMB, CKMBINDEX, TROPONINI in the last 168 hours.  BNP: Invalid input(s): POCBNP  CBG: No results for input(s): GLUCAP in the last 168 hours.  Microbiology: Results for orders placed or performed during the hospital encounter of 11/26/18  SARS CORONAVIRUS 2 (TAT 6-24 HRS) Nasopharyngeal Nasopharyngeal Swab     Status: None   Collection Time: 12/08/18 10:05 PM   Specimen: Nasopharyngeal Swab  Result Value Ref Range Status   SARS Coronavirus 2 NEGATIVE NEGATIVE Final   Comment: (NOTE) SARS-CoV-2 target nucleic acids are NOT DETECTED. The SARS-CoV-2 RNA is generally detectable in upper and lower respiratory specimens during the acute phase of infection. Negative results do not preclude SARS-CoV-2 infection, do not rule out co-infections with other pathogens, and should not be used as the sole basis for treatment or other patient management decisions. Negative results must be combined with clinical observations, patient history, and epidemiological information. The expected result is Negative. Fact Sheet for Patients: SugarRoll.be Fact Sheet for Healthcare Providers: https://www.woods-mathews.com/ This test is not yet approved or cleared by the [place] FDA and  has been authorized for detection and/or diagnosis of SARS-CoV-2 by FDA under an Emergency Use Authorization (EUA). This EUA will remain  in effect (meaning this test can be used) for the duration of the COVID-19 declaration under Section 56 4(b)(1) of the Act, 21 U.S.C. section 360bbb-3(b)(1), unless the authorization is terminated or revoked sooner. Performed at [facility], [place] [address] [place], [place] [address]   Culture, body fluid-bottle     Status: Abnormal   Collection Time: 12/09/18 11:53 AM   Specimen: BILE  Result Value Ref Range Status   Specimen Description BILE  Final   Special Requests NONE  Final   Gram Stain   Final    IN BOTH AEROBIC AND ANAEROBIC BOTTLES GRAM POSITIVE COCCI Performed at [facility], [place] [address] [place], [place] [address]    Culture ENTEROCOCCUS FAECIUM (A)  Final   Report Status 12/12/2018 FINAL  Final   Organism ID, Bacteria ENTEROCOCCUS FAECIUM  Final      Susceptibility   Enterococcus faecium - MIC*    AMPICILLIN >=32 RESISTANT Resistant     VANCOMYCIN 1 SENSITIVE Sensitive     GENTAMICIN SYNERGY SENSITIVE Sensitive     * ENTEROCOCCUS FAECIUM  Gram stain     Status: None    Collection Time: 12/09/18 11:53 AM   Specimen: BILE  Result Value Ref Range Status   Specimen Description BILE  Final   Special Requests NONE  Final   Gram Stain   Final    RARE WBC PRESENT, PREDOMINANTLY MONONUCLEAR MODERATE GRAM POSITIVE COCCI IN PAIRS IN CHAINS Performed at [facility], [place] [address], [place], [place] [address]    Report Status 12/09/2018 FINAL  Final    Coagulation Studies: No results for input(s): LABPROT, INR in the last 72 hours.  Urinalysis: No results for input(s): COLORURINE, LABSPEC, PHURINE, GLUCOSEU, HGBUR, BILIRUBINUR, KETONESUR, PROTEINUR, UROBILINOGEN, NITRITE, LEUKOCYTESUR in the last 72 hours.  Invalid input(s): APPERANCEUR    Imaging: No results found.   Medications:    . morphine  3 mg Intravenous Once     Assessment/ Plan:  68 y.o. male with a PMHx of asthma, nicotine abuse, hypertension, BPH, morbid obesity, peripheral neuropathy, chronic pain syndrome, recurrent diverticulitis with colonic perforation status post right hemicolectomy and colostomy/ileostomy placement, cholecystectomy tube placement, chronic kidney disease stage III with acute kidney injury, who was admitted to [facility]/2018 for ongoing treatment of generalized debility, acute renal failure, malnutrition.    1.  Acute renal failure/chronic kidney disease stage IIIA baseline EGFR 51.  The patient's renal function has improved.  Creatinine down to 2.49 with a BUN of 26.  Acidosis also improved with serum bicarbonate of 23.  Continue IV fluid hydration for now.  2.  Hypernatremia.  Sodium now down to 138.  Continue hydration for now.  3.  Metabolic acidosis.  Significantly improved as above.  4.  Hypokalemia.  Serum potassium low at 3.2.  We will replete this with potassium chloride 40 mEq IV today.   LOS: 0 Brent Newman 10/30/20208:39 AM

## 2018-12-18 NOTE — Consult Note (Signed)
Infectious Disease Consultation   Brent Newman  RUE:[id number]  DOB: January 26, 1951  DOA: 11/26/2018  Requesting physician: Dr. Sharyon Medicus  Reason for consultation: Antibiotic recommendations   History of Present Illness: Brent Newman is an 68 y.o. male with history of asthma, nicotine abuse, hypertension, BPH, morbid obesity, chronic kidney disease, chronic pain, history of diverticulitis who presented to the outside facility complaining of abdominal pain for 2 weeks.  Initially was treated with Augmentin.  However, the pain continued therefore he presented to the hospital again.  CT at that time showed diverticulitis with possible partial small bowel obstruction.  He was transferred to [facility] for management of small bowel obstruction and acute respiratory failure with hypoxia.  He was kept n.p.o., started on antibiotics.  However, he started having worsening abdominal pain and distention.  Repeat CT scan showed free intraperitoneal air with multiple fluid collections consistent with abscess.  He was taken to the OR for exploration.  Findings showed large perforation of his colon and frank stool throughout his abdomen.  He underwent right hemicolectomy and drainage of the abdominal abscess.  His abdomen was kept open with a wound VAC in place.  Over the next 2 weeks he was taken to the operating room multiple times for washouts and drainage of further abscesses and small bowel resections with creation of ileostomy on the right side.  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  Closure of small bowel perforation and creation of second more proximal ileostomy on his left side of his abdomen keeping his abdomen  open on each occasion.  On 8/29 he was taken back to the operating room for bilateral ileostomy revision as well as abdominal wall closure and extubation.  On October 20, 2018 he developed acute renal failure and abdominal collections.  He was thought to have ATN from significant prerenal injury with high ostomy and NG tube output.  His creatinine increased to 7.  Subsequently came down to 1.7.  He had 2 abdominal wall fluid collections which were aspirated and drains placed.  Drain was removed October 5 below his left ileostomy.  While he was at [facility] he received treatment with Zosyn, cefepime, Flagyl, Eraxis.  Cultures from the surgery have gram-negative rods but no further work-up due to massive contamination of stool in the abdomen.  Due to his complex medical problems he was transferred to [facility].  On 12/08/2018 cardiology was consulted for palpitations.  He was given metoprolol.  On 12/05/2018 patient noted to have fever.  CT of the abdomen/pelvis and right upper quadrant ultrasound showed distended gallbladder with wall thickening, sludge and pericholecystic stranding/cholelithiasis.  HIDA scan on 12/07/2018 showed findings concerning for acute cholecystitis.  General surgery was consulted and patient apparently was not a surgical candidate and recommended IR consultation.  He underwent cholecystostomy tube placement on 12/09/2018.  Patient at this time does not have fever.  He denies having any nausea, vomiting.  He is on treatment with IV vancomycin, Flagyl.   Review of Systems:  As per HPI otherwise review of systems negative.    Past Medical History: Acute respiratory failure with hypoxemia, COPD with acute exacerbation, GERD, hypothyroidism, morbid obesity, tobacco  abuse, chronic kidney disease stage III, anemia, myoclonic jerking  Past Surgical History: Multiple abdominal surgeries as mentioned above  Allergies: Gabapentin  Social History: He has history of smoking.  No  history of alcohol or recreational drug abuse   Family History: History of stroke, coronary artery disease, hypertension  Physical Exam: Vitals:   12/09/18 1110 12/09/18 1115 12/09/18 1120 12/09/18 1126  BP: 121/76 126/87 138/80 126/70  Pulse: (!) 101 88 88 91  Resp: 20 (!) 22 17 17   SpO2: 100% 100% 96% 93%  Vitals: Temperature 99.5, pulse 93, respiratory rate 23, blood pressure 134/71, oxygen saturation 95% on oxygen nasal cannula.  Constitutional: Obese, ill-appearing male, awake, not in any acute distress at this time Eyes: PERLA, EOMI, irises appear normal, anicteric sclera,  ENMT: external ears and nose appear normal, normal hearing            Lips appears normal, oropharynx mucosa, appear normal  Neck: neck appears normal, no masses, normal ROM CVS: S1-S2 clear, no murmur Respiratory: Decreased breath sound lower lobes otherwise clear to auscultation. respiratory effort normal. No accessory muscle use.  Abdomen: Morbidly obese male, midline abdominal wound with wound VAC, has erythema of the abdominal wall, bilateral ileostomy's, right upper quadrant cholecystostomy drain Musculoskeletal: Edema lower extremities Neuro: Has generalized weakness, no focal deficits Psych: judgement and insight appear normal, stable mood and affect  Data reviewed:  I have personally reviewed following labs and imaging studies Labs:  CBC: Recent Labs  Lab 12/16/18 0619  WBC 7.5  HGB 8.6*  HCT 29.5*  MCV 89.4  PLT 445*    Basic Metabolic Panel: Recent Labs  Lab 12/12/18 0525 12/16/18 0619 12/17/18 0857 12/18/18 0503  NA 140 146*  --  138  K 3.5 3.3* 2.9* 3.2*  CL 110 119*  --  103  CO2 16* 16*  --  23  GLUCOSE 87 76  --  89  BUN 50* 33*  --  26*  CREATININE 4.15* 3.09*  --  2.49*  CALCIUM 9.4 9.1  --  8.9  MG  --  1.5* 1.8  --    GFR CrCl cannot be calculated (Unknown ideal weight.). Liver Function Tests: No results for input(s): AST, ALT, ALKPHOS, BILITOT, PROT, ALBUMIN in  the last 168 hours. No results for input(s): LIPASE, AMYLASE in the last 168 hours. No results for input(s): AMMONIA in the last 168 hours. Coagulation profile No results for input(s): INR, PROTIME in the last 168 hours.  Cardiac Enzymes: No results for input(s): CKTOTAL, CKMB, CKMBINDEX, TROPONINI in the last 168 hours. BNP: Invalid input(s): POCBNP CBG: No results for input(s): GLUCAP in the last 168 hours. D-Dimer No results for input(s): DDIMER in the last 72 hours. Hgb A1c No results for input(s): HGBA1C in the last 72 hours. Lipid Profile No results for input(s): CHOL, HDL, LDLCALC, TRIG, CHOLHDL, LDLDIRECT in the last 72 hours. Thyroid function studies No results for input(s): TSH, T4TOTAL, T3FREE, THYROIDAB in the last 72 hours.  Invalid input(s): FREET3 Anemia work up No results for input(s): VITAMINB12, FOLATE, FERRITIN, TIBC, IRON, RETICCTPCT in the last 72 hours. Urinalysis No results found for: COLORURINE, APPEARANCEUR, LABSPEC, PHURINE, GLUCOSEU, HGBUR, BILIRUBINUR, KETONESUR, PROTEINUR, UROBILINOGEN, NITRITE, LEUKOCYTESUR   Microbiology Recent Results (from the past 240 hour(s))  SARS CORONAVIRUS 2 (TAT 6-24 HRS) Nasopharyngeal Nasopharyngeal Swab     Status: None   Collection Time: 12/08/18 10:05 PM   Specimen: Nasopharyngeal Swab  Result Value Ref Range Status   SARS Coronavirus  2 NEGATIVE NEGATIVE Final    Comment: (NOTE) SARS-CoV-2 target nucleic acids are NOT DETECTED. The SARS-CoV-2 RNA is generally detectable in upper and lower respiratory specimens during the acute phase of infection. Negative results do not preclude SARS-CoV-2 infection, do not rule out co-infections with other pathogens, and should not be used as the sole basis for treatment or other patient management decisions. Negative results must be combined with clinical observations, patient history, and epidemiological information. The expected result is Negative. Fact Sheet for  Patients: HairSlick.no Fact Sheet for Healthcare Providers: quierodirigir.com This test is not yet approved or cleared by the [place] FDA and  has been authorized for detection and/or diagnosis of SARS-CoV-2 by FDA under an Emergency Use Authorization (EUA). This EUA will remain  in effect (meaning this test can be used) for the duration of the COVID-19 declaration under Section 56 4(b)(1) of the Act, 21 U.S.C. section 360bbb-3(b)(1), unless the authorization is terminated or revoked sooner. Performed at [facility], [address], [place], [place] [address]   Culture, body fluid-bottle     Status: Abnormal   Collection Time: 12/09/18 11:53 AM   Specimen: BILE  Result Value Ref Range Status   Specimen Description BILE  Final   Special Requests NONE  Final   Gram Stain   Final    IN BOTH AEROBIC AND ANAEROBIC BOTTLES GRAM POSITIVE COCCI Performed at [facility], [address], [place], [place] [address]    Culture ENTEROCOCCUS FAECIUM (A)  Final   Report Status 12/12/2018 FINAL  Final   Organism ID, Bacteria ENTEROCOCCUS FAECIUM  Final      Susceptibility   Enterococcus faecium - MIC*    AMPICILLIN >=32 RESISTANT Resistant     VANCOMYCIN 1 SENSITIVE Sensitive     GENTAMICIN SYNERGY SENSITIVE Sensitive     * ENTEROCOCCUS FAECIUM  Gram stain     Status: None   Collection Time: 12/09/18 11:53 AM   Specimen: BILE  Result Value Ref Range Status   Specimen Description BILE  Final   Special Requests NONE  Final   Gram Stain   Final    RARE WBC PRESENT, PREDOMINANTLY MONONUCLEAR MODERATE GRAM POSITIVE COCCI IN PAIRS IN CHAINS Performed at [facility], [address] [place], [place] [address]    Report Status 12/09/2018 FINAL  Final     Inpatient Medications:   Please see MAR   Impression/Recommendations Cecal perforation with peritonitis Intra-abdominal abscesses status post  hemicolectomy, drainage of abscesses Midline abdominal wound with wound VAC Status post small bowel resection with ileostomy Acute cholecystitis status post cholecystostomy Infection with Enterococcus faecium Sepsis Recent acute renal failure Chronic kidney disease stage III-4 Morbid obesity with BMI of 49 Protein calorie malnutrition COPD Hypokalemia  Cecal perforation with peritonitis: Patient with history of diverticulitis.  He presented with abdominal pain, small bowel obstruction and then had perforation with peritonitis at the outside facility.  He subsequently developed intra-abdominal abscesses.  He underwent multiple abdominal surgeries.  He is status post hemicolectomy with drainage of the abscesses, status post small bowel resection with ileostomy.  He received multiple antibiotics at the outside facility.  Here he has received treatment with IV vancomycin, Flagyl.   Midline abdominal wound: Patient as a result of multiple surgeries as mentioned above has midline abdominal wound with wound VAC in place.  He also has significant erythema.  At this time would recommend to discontinue the IV vancomycin.  We will switch him  to Zyvox, cefepime.  Continue IV Flagyl for now.  Plan to treat for duration of 1 week.  Continue to monitor the wound.  Continue local wound care.  If the wound is not improving, then he may need repeat imaging with CT of the abdomen/pelvis to evaluate for deeper infection/abscess.  Acute cholecystitis: This was on HIDA scan.  Patient not a surgical candidate.  Therefore underwent cholecystostomy tube placement.  Biliary fluid cultures showing Enterococcus PCM.  He is continuing to have significant output from the biliary drain.  He has received treatment with IV vancomycin.  However, given his super morbid obesity and recent acute renal failure at the outside facility reluctant to continue with IV vancomycin.  Therefore will switch him to Zyvox.  Please monitor CBC,  platelet counts and BMP very closely while on antibiotics.  Also make sure he does not have any interactions with psychiatric medications while on the Zyvox.  Sepsis: Currently sepsis has resolved.  However, he is very high risk for recurrent sepsis.  He continues to have abdominal wound with a wound VAC with significant erythema and drainage.  Antibiotics as mentioned above.  If he starts having any worsening fevers or worsening leukocytosis would recommend to send for pan cultures.  Recent acute renal failure: Patient likely has baseline chronic kidney disease stage III-4.  At the outside facility he had acute renal failure on the chronic renal failure.  Monitor BUN/creatinine closely.  Further management per the primary team.  COPD: Management per the primary team.  Protein calorie malnutrition: Management per the primary team.  Morbid obesity: Continue supportive management per primary team.  Hypokalemia: Replacement per the primary team.  Letter electrolytes and replace as needed.  Due to his complex medical problems he is very high risk for worsening anticoagulation.  Thank you for this consultation.   The above document was dictated using the voice-recognition device.  Yaakov Guthrie M.D. 12/18/2018, 5:58 PM

## 2018-12-19 LAB — POTASSIUM: Potassium: 3.4 mmol/L — ABNORMAL LOW (ref 3.5–5.1)

## 2018-12-20 LAB — POTASSIUM: Potassium: 3.7 mmol/L (ref 3.5–5.1)

## 2018-12-21 NOTE — Progress Notes (Signed)
[facility]  ROUNDING NOTE   Subjective:  Patient overall feeling better.  No new creatinine today. Good urine output noted at 1.4 L over the preceding 24 hours.   Objective:  Vital signs in last 24 hours:  Temperature 98.9 pulse 107 respirations 43 blood pressure 95/49  Physical Exam: General: No acute distress  Head: Normocephalic, atraumatic. Moist oral mucosal membranes  Eyes: Anicteric  Neck: Supple, trachea midline  Lungs:  Clear to auscultation, normal effort  Heart: S1S2 no rubs  Abdomen:  Soft, nontender, ileostomy and cholecystostomy tube present  Extremities: No peripheral edema.  Neurologic: Awake, alert, following commands  Skin: No lesions       Basic Metabolic Panel: Recent Labs  Lab 12/16/18 0619 12/17/18 0857 12/18/18 0503 12/19/18 0830 12/20/18 1419  NA 146*  --  138  --   --   K 3.3* 2.9* 3.2* 3.4* 3.7  CL 119*  --  103  --   --   CO2 16*  --  23  --   --   GLUCOSE 76  --  89  --   --   BUN 33*  --  26*  --   --   CREATININE 3.09*  --  2.49*  --   --   CALCIUM 9.1  --  8.9  --   --   MG 1.5* 1.8  --   --   --     Liver Function Tests: No results for input(s): AST, ALT, ALKPHOS, BILITOT, PROT, ALBUMIN in the last 168 hours. No results for input(s): LIPASE, AMYLASE in the last 168 hours. No results for input(s): AMMONIA in the last 168 hours.  CBC: Recent Labs  Lab 12/16/18 0619  WBC 7.5  HGB 8.6*  HCT 29.5*  MCV 89.4  PLT 445*    Cardiac Enzymes: No results for input(s): CKTOTAL, CKMB, CKMBINDEX, TROPONINI in the last 168 hours.  BNP: Invalid input(s): POCBNP  CBG: No results for input(s): GLUCAP in the last 168 hours.  Microbiology: Results for orders placed or performed during the hospital encounter of 11/26/18  SARS CORONAVIRUS 2 (TAT 6-24 HRS) Nasopharyngeal Nasopharyngeal Swab     Status: None   Collection Time: 12/08/18 10:05 PM   Specimen: Nasopharyngeal Swab  Result Value Ref Range Status   SARS  Coronavirus 2 NEGATIVE NEGATIVE Final    Comment: (NOTE) SARS-CoV-2 target nucleic acids are NOT DETECTED. The SARS-CoV-2 RNA is generally detectable in upper and lower respiratory specimens during the acute phase of infection. Negative results do not preclude SARS-CoV-2 infection, do not rule out co-infections with other pathogens, and should not be used as the sole basis for treatment or other patient management decisions. Negative results must be combined with clinical observations, patient history, and epidemiological information. The expected result is Negative. Fact Sheet for Patients: SugarRoll.be Fact Sheet for Healthcare Providers: https://www.woods-mathews.com/ This test is not yet approved or cleared by the [place] FDA and  has been authorized for detection and/or diagnosis of SARS-CoV-2 by FDA under an Emergency Use Authorization (EUA). This EUA will remain  in effect (meaning this test can be used) for the duration of the COVID-19 declaration under Section 56 4(b)(1) of the Act, 21 U.S.C. section 360bbb-3(b)(1), unless the authorization is terminated or revoked sooner. Performed at [facility], [place] [address] [place], [place] [address]   Culture, body fluid-bottle     Status: Abnormal   Collection Time: 12/09/18 11:53 AM   Specimen: BILE  Result Value Ref Range Status   Specimen Description BILE  Final   Special Requests NONE  Final   Gram Stain   Final    IN BOTH AEROBIC AND ANAEROBIC BOTTLES GRAM POSITIVE COCCI Performed at [facility], [address] [place], [place] [address]    Culture ENTEROCOCCUS FAECIUM (A)  Final   Report Status 12/12/2018 FINAL  Final   Organism ID, Bacteria ENTEROCOCCUS FAECIUM  Final      Susceptibility   Enterococcus faecium - MIC*    AMPICILLIN >=32 RESISTANT Resistant     VANCOMYCIN 1 SENSITIVE Sensitive     GENTAMICIN SYNERGY SENSITIVE Sensitive     * ENTEROCOCCUS  FAECIUM  Gram stain     Status: None   Collection Time: 12/09/18 11:53 AM   Specimen: BILE  Result Value Ref Range Status   Specimen Description BILE  Final   Special Requests NONE  Final   Gram Stain   Final    RARE WBC PRESENT, PREDOMINANTLY MONONUCLEAR MODERATE GRAM POSITIVE COCCI IN PAIRS IN CHAINS Performed at [facility], [place] [address], [place], [place] [address]    Report Status 12/09/2018 FINAL  Final    Coagulation Studies: No results for input(s): LABPROT, INR in the last 72 hours.  Urinalysis: No results for input(s): COLORURINE, LABSPEC, PHURINE, GLUCOSEU, HGBUR, BILIRUBINUR, KETONESUR, PROTEINUR, UROBILINOGEN, NITRITE, LEUKOCYTESUR in the last 72 hours.  Invalid input(s): APPERANCEUR    Imaging: No results found.   Medications:    . morphine  3 mg Intravenous Once     Assessment/ Plan:  68 y.o. male with a PMHx of asthma, nicotine abuse, hypertension, BPH, morbid obesity, peripheral neuropathy, chronic pain syndrome, recurrent diverticulitis with colonic perforation status post right hemicolectomy and colostomy/ileostomy placement, cholecystectomy tube placement, chronic kidney disease stage III with acute kidney injury, who was [facility] for ongoing treatment of generalized debility, acute renal failure, malnutrition.    1.  Acute renal failure/chronic kidney disease stage IIIA baseline EGFR 51.  No new renal function testing available today.  We plan to follow-up renal parameters today.  2.  Hypernatremia.  Previously improved.  Repeat serum sodium today.  3.  Metabolic acidosis.  Significantly improved.  Previously was on bicarbonate drip.  4.  Hypokalemia.  Potassium up to 3.7 at last check.   LOS: 0 Godwin Tedesco 11/2/20208:39 AM

## 2018-12-22 NOTE — Progress Notes (Signed)
Patient ID: Brent Newman, male   DOB: 07/06/1950, 68 y.o.   MRN: [id number]   Percutaneous chole drain placed in IR 12/09/18  Flushes easily draining well-- bile  Site clean and dry  To remain 8 weeks    ##### OP follow up orders in place for 8 weeks injection If pt is still IP---- will need drain injection in IR (please order when appropriate)

## 2018-12-23 LAB — COMPREHENSIVE METABOLIC PANEL
ALT: 13 U/L (ref 0–44)
AST: 19 U/L (ref 15–41)
Albumin: 1.7 g/dL — ABNORMAL LOW (ref 3.5–5.0)
Alkaline Phosphatase: 86 U/L (ref 38–126)
Anion gap: 14 (ref 5–15)
BUN: 31 mg/dL — ABNORMAL HIGH (ref 8–23)
CO2: 38 mmol/L — ABNORMAL HIGH (ref 22–32)
Calcium: 8.6 mg/dL — ABNORMAL LOW (ref 8.9–10.3)
Chloride: 86 mmol/L — ABNORMAL LOW (ref 98–111)
Creatinine, Ser: 2.07 mg/dL — ABNORMAL HIGH (ref 0.61–1.24)
GFR calc Af Amer: 38 mL/min — ABNORMAL LOW (ref >60)
GFR calc non Af Amer: 32 mL/min — ABNORMAL LOW (ref >60)
Glucose, Bld: 85 mg/dL (ref 70–99)
Potassium: 3.3 mmol/L — ABNORMAL LOW (ref 3.5–5.1)
Sodium: 138 mmol/L (ref 135–145)
Total Bilirubin: 0.3 mg/dL (ref 0.3–1.2)
Total Protein: 6.4 g/dL — ABNORMAL LOW (ref 6.5–8.1)

## 2018-12-23 LAB — CBC
HCT: 30.3 % — ABNORMAL LOW (ref 39.0–52.0)
Hemoglobin: 9 g/dL — ABNORMAL LOW (ref 13.0–17.0)
MCH: 26.1 pg (ref 26.0–34.0)
MCHC: 29.7 g/dL — ABNORMAL LOW (ref 30.0–36.0)
MCV: 87.8 fL (ref 80.0–100.0)
Platelets: 361 10*3/uL (ref 150–400)
RBC: 3.45 MIL/uL — ABNORMAL LOW (ref 4.22–5.81)
RDW: 18.2 % — ABNORMAL HIGH (ref 11.5–15.5)
WBC: 8.6 10*3/uL (ref 4.0–10.5)
nRBC: 0 % (ref 0.0–0.2)

## 2018-12-23 NOTE — Progress Notes (Signed)
[name]  ROUNDING NOTE   Subjective:  Patient seen and evaluated at bedside. Renal function has improved. Creatinine down to 3.07. Serum bicarbonate a bit higher now at 38.   Objective:  Vital signs in last 24 hours:  Temperature 97.8 pulse 25 respirations 22 blood pressure 102/63  Physical Exam: General: No acute distress  Head: Normocephalic, atraumatic. Moist oral mucosal membranes  Eyes: Anicteric  Neck: Supple, trachea midline  Lungs:  Clear to auscultation, normal effort  Heart: S1S2 no rubs  Abdomen:  Soft, nontender, ileostomy and cholecystostomy tube present  Extremities: No peripheral edema.  Neurologic: Awake, alert, following commands  Skin: No lesions       Basic Metabolic Panel: Recent Labs  Lab 12/17/18 0857 12/18/18 0503 12/19/18 0830 12/20/18 1419 12/23/18 0448  NA  --  138  --   --  138  K 2.9* 3.2* 3.4* 3.7 3.3*  CL  --  103  --   --  86*  CO2  --  23  --   --  38*  GLUCOSE  --  89  --   --  85  BUN  --  26*  --   --  31*  CREATININE  --  2.49*  --   --  2.07*  CALCIUM  --  8.9  --   --  8.6*  MG 1.8  --   --   --   --     Liver Function Tests: Recent Labs  Lab 12/23/18 0448  AST 19  ALT 13  ALKPHOS 86  BILITOT 0.3  PROT 6.4*  ALBUMIN 1.7*   No results for input(s): LIPASE, AMYLASE in the last 168 hours. No results for input(s): AMMONIA in the last 168 hours.  CBC: Recent Labs  Lab 12/23/18 0448  WBC 8.6  HGB 9.0*  HCT 30.3*  MCV 87.8  PLT 361    Cardiac Enzymes: No results for input(s): CKTOTAL, CKMB, CKMBINDEX, TROPONINI in the last 168 hours.  BNP: Invalid input(s): POCBNP  CBG: No results for input(s): GLUCAP in the last 168 hours.  Microbiology: Results for orders placed or performed during the hospital encounter of 11/26/18  SARS CORONAVIRUS 2 (TAT 6-24 HRS) Nasopharyngeal Nasopharyngeal Swab     Status: None   Collection Time: 12/08/18 10:05 PM   Specimen: Nasopharyngeal Swab   Result Value Ref Range Status   SARS Coronavirus 2 NEGATIVE NEGATIVE Final    Comment: (NOTE) SARS-CoV-2 target nucleic acids are NOT DETECTED. The SARS-CoV-2 RNA is generally detectable in upper and lower respiratory specimens during the acute phase of infection. Negative results do not preclude SARS-CoV-2 infection, do not rule out co-infections with other pathogens, and should not be used as the sole basis for treatment or other patient management decisions. Negative results must be combined with clinical observations, patient history, and epidemiological information. The expected result is Negative. Fact Sheet for Patients: SugarRoll.be Fact Sheet for Healthcare Providers: https://www.woods-mathews.com/ This test is not yet approved or cleared by the [place] FDA and  has been authorized for detection and/or diagnosis of SARS-CoV-2 by FDA under an Emergency Use Authorization (EUA). This EUA will remain  in effect (meaning this test can be used) for the duration of the COVID-19 declaration under Section 56 4(b)(1) of the Act, 21 U.S.C. section 360bbb-3(b)(1), unless the authorization is terminated or revoked sooner. Performed at [facility], [place] [address] [place], [place] [address]   Culture, body fluid-bottle  Status: Abnormal   Collection Time: 12/09/18 11:53 AM   Specimen: BILE  Result Value Ref Range Status   Specimen Description BILE  Final   Special Requests NONE  Final   Gram Stain   Final    IN BOTH AEROBIC AND ANAEROBIC BOTTLES GRAM POSITIVE COCCI Performed at [facility], [address] [place], [place] [address]    Culture ENTEROCOCCUS FAECIUM (A)  Final   Report Status 12/12/2018 FINAL  Final   Organism ID, Bacteria ENTEROCOCCUS FAECIUM  Final      Susceptibility   Enterococcus faecium - MIC*    AMPICILLIN >=32 RESISTANT Resistant     VANCOMYCIN 1 SENSITIVE Sensitive     GENTAMICIN SYNERGY  SENSITIVE Sensitive     * ENTEROCOCCUS FAECIUM  Gram stain     Status: None   Collection Time: 12/09/18 11:53 AM   Specimen: BILE  Result Value Ref Range Status   Specimen Description BILE  Final   Special Requests NONE  Final   Gram Stain   Final    RARE WBC PRESENT, PREDOMINANTLY MONONUCLEAR MODERATE GRAM POSITIVE COCCI IN PAIRS IN CHAINS Performed at [facility], [place] [address], [place], [place] [address]    Report Status 12/09/2018 FINAL  Final    Coagulation Studies: No results for input(s): LABPROT, INR in the last 72 hours.  Urinalysis: No results for input(s): COLORURINE, LABSPEC, PHURINE, GLUCOSEU, HGBUR, BILIRUBINUR, KETONESUR, PROTEINUR, UROBILINOGEN, NITRITE, LEUKOCYTESUR in the last 72 hours.  Invalid input(s): APPERANCEUR    Imaging: No results found.   Medications:    . morphine  3 mg Intravenous Once     Assessment/ Plan:  68 y.o. male with a PMHx of asthma, nicotine abuse, hypertension, BPH, morbid obesity, peripheral neuropathy, chronic pain syndrome, recurrent diverticulitis with colonic perforation status post right hemicolectomy and colostomy/ileostomy placement, cholecystectomy tube placement, chronic kidney disease stage III with acute kidney injury, who was admitted to [facility] for ongoing treatment of generalized debility, acute renal failure, malnutrition.    1.  Acute renal failure/chronic kidney disease stage IIIA baseline EGFR 51.  Overall renal function has improved.  Creatinine down to 2.07.  Continue to monitor renal parameters periodically.  2.  Hypernatremia.  Resolved.  3.  Metabolic acidosis.  Much improved in fact serum bicarbonate high at 38.  Discontinue bicarbonate drip.  4.  Hypokalemia.  Potassium a bit low at 3.3 likely secondary to alkalemia.  Start bicarbonate drip.   LOS: 0 Brent Newman 11/4/202012:02 PM

## 2018-12-24 ENCOUNTER — Institutional Professional Consult (permissible substitution) (HOSPITAL_COMMUNITY): Payer: Medicare HMO

## 2018-12-25 LAB — BASIC METABOLIC PANEL
Anion gap: 17 — ABNORMAL HIGH (ref 5–15)
BUN: 41 mg/dL — ABNORMAL HIGH (ref 8–23)
CO2: 33 mmol/L — ABNORMAL HIGH (ref 22–32)
Calcium: 8.4 mg/dL — ABNORMAL LOW (ref 8.9–10.3)
Chloride: 85 mmol/L — ABNORMAL LOW (ref 98–111)
Creatinine, Ser: 2.33 mg/dL — ABNORMAL HIGH (ref 0.61–1.24)
GFR calc Af Amer: 33 mL/min — ABNORMAL LOW (ref >60)
GFR calc non Af Amer: 28 mL/min — ABNORMAL LOW (ref >60)
Glucose, Bld: 161 mg/dL — ABNORMAL HIGH (ref 70–99)
Potassium: 3.4 mmol/L — ABNORMAL LOW (ref 3.5–5.1)
Sodium: 135 mmol/L (ref 135–145)

## 2018-12-25 LAB — MAGNESIUM: Magnesium: 1.4 mg/dL — ABNORMAL LOW (ref 1.7–2.4)

## 2018-12-25 NOTE — Progress Notes (Signed)
[facility]  ROUNDING NOTE   Subjective:  Pt had presyncopal event due to tachycardia earlier today. Pt performed vagal maneuver and HR improved.  HR still 110 at the moment.  Cardiology following.    Objective:  Vital signs in last 24 hours:  Temperature 98.6 pulse 110 respirations 18 blood pressure 103/47  Physical Exam: General: No acute distress  Head: Normocephalic, atraumatic. Moist oral mucosal membranes  Eyes: Anicteric  Neck: Supple, trachea midline  Lungs:  Clear to auscultation, normal effort  Heart: S1S2 no rubs  Abdomen:  Soft, nontender, ileostomy and cholecystostomy tube present  Extremities: No peripheral edema.  Neurologic: Awake, alert, following commands  Skin: No lesions       Basic Metabolic Panel: Recent Labs  Lab 12/19/18 0830 12/20/18 1419 12/23/18 0448 12/25/18 1008  NA  --   --  138 135  K 3.4* 3.7 3.3* 3.4*  CL  --   --  86* 85*  CO2  --   --  38* 33*  GLUCOSE  --   --  85 161*  BUN  --   --  31* 41*  CREATININE  --   --  2.07* 2.33*  CALCIUM  --   --  8.6* 8.4*  MG  --   --   --  1.4*    Liver Function Tests: Recent Labs  Lab 12/23/18 0448  AST 19  ALT 13  ALKPHOS 86  BILITOT 0.3  PROT 6.4*  ALBUMIN 1.7*   No results for input(s): LIPASE, AMYLASE in the last 168 hours. No results for input(s): AMMONIA in the last 168 hours.  CBC: Recent Labs  Lab 12/23/18 0448  WBC 8.6  HGB 9.0*  HCT 30.3*  MCV 87.8  PLT 361    Cardiac Enzymes: No results for input(s): CKTOTAL, CKMB, CKMBINDEX, TROPONINI in the last 168 hours.  BNP: Invalid input(s): POCBNP  CBG: No results for input(s): GLUCAP in the last 168 hours.  Microbiology: Results for orders placed or performed during the hospital encounter of 11/26/18  SARS CORONAVIRUS 2 (TAT 6-24 HRS) Nasopharyngeal Nasopharyngeal Swab     Status: None   Collection Time: 12/08/18 10:05 PM   Specimen: Nasopharyngeal Swab  Result Value Ref Range Status   SARS  Coronavirus 2 NEGATIVE NEGATIVE Final    Comment: (NOTE) SARS-CoV-2 target nucleic acids are NOT DETECTED. The SARS-CoV-2 RNA is generally detectable in upper and lower respiratory specimens during the acute phase of infection. Negative results do not preclude SARS-CoV-2 infection, do not rule out co-infections with other pathogens, and should not be used as the sole basis for treatment or other patient management decisions. Negative results must be combined with clinical observations, patient history, and epidemiological information. The expected result is Negative. Fact Sheet for Patients: SugarRoll.be Fact Sheet for Healthcare Providers: https://www.woods-mathews.com/ This test is not yet approved or cleared by the [place] FDA and  has been authorized for detection and/or diagnosis of SARS-CoV-2 by FDA under an Emergency Use Authorization (EUA). This EUA will remain  in effect (meaning this test can be used) for the duration of the COVID-19 declaration under Section 56 4(b)(1) of the Act, 21 U.S.C. section 360bbb-3(b)(1), unless the authorization is terminated or revoked sooner. Performed at [facility], [place] [address] [place], [place] [address]   Culture, body fluid-bottle     Status: Abnormal   Collection Time: 12/09/18 11:53 AM   Specimen: BILE  Result Value Ref Range Status   Specimen  Description BILE  Final   Special Requests NONE  Final   Gram Stain   Final    IN BOTH AEROBIC AND ANAEROBIC BOTTLES GRAM POSITIVE COCCI Performed at [facility], [place] [address], [place], [place] [address]    Culture ENTEROCOCCUS FAECIUM (A)  Final   Report Status 12/12/2018 FINAL  Final   Organism ID, Bacteria ENTEROCOCCUS FAECIUM  Final      Susceptibility   Enterococcus faecium - MIC*    AMPICILLIN >=32 RESISTANT Resistant     VANCOMYCIN 1 SENSITIVE Sensitive     GENTAMICIN SYNERGY SENSITIVE Sensitive     * ENTEROCOCCUS  FAECIUM  Gram stain     Status: None   Collection Time: 12/09/18 11:53 AM   Specimen: BILE  Result Value Ref Range Status   Specimen Description BILE  Final   Special Requests NONE  Final   Gram Stain   Final    RARE WBC PRESENT, PREDOMINANTLY MONONUCLEAR MODERATE GRAM POSITIVE COCCI IN PAIRS IN CHAINS Performed at [facility], [place] [address] [place], [place] [address]    Report Status 12/09/2018 FINAL  Final    Coagulation Studies: No results for input(s): LABPROT, INR in the last 72 hours.  Urinalysis: No results for input(s): COLORURINE, LABSPEC, PHURINE, GLUCOSEU, HGBUR, BILIRUBINUR, KETONESUR, PROTEINUR, UROBILINOGEN, NITRITE, LEUKOCYTESUR in the last 72 hours.  Invalid input(s): APPERANCEUR    Imaging: Ct Abdomen Pelvis Wo Contrast  Result Date: 12/24/2018 CLINICAL DATA:  F/u pt with percutaneous drain - placed 12/09/2018 Pt states he is in a lot of pain on rt side No contrastAbd pain, fever, abscess suspected EXAM: CT ABDOMEN AND PELVIS WITHOUT CONTRAST TECHNIQUE: Multidetector CT imaging of the abdomen and pelvis was performed following the standard protocol without IV contrast. COMPARISON:  12/05/2018 FINDINGS: Lower chest: Minimal RIGHT pleural thickening and RIGHT basilar atelectasis. Hepatobiliary: The liver is unremarkable. Status post cholecystostomy tube placement. There is small amount of debris and air surrounding the cholecystostomy tube. There has been interval decompression of the gallbladder. Pancreas: Unremarkable. No pancreatic ductal dilatation or surrounding inflammatory changes. Spleen: Normal in size without focal abnormality. Adrenals/Urinary Tract: Adrenal glands are normal in appearance. There are coarse intrarenal calcifications bilaterally. Small LEFT renal cyst. No hydronephrosis. The ureters are unremarkable. The bladder and visualized portion of the urethra are normal. Stomach/Bowel: Stomach is unremarkable. There are bilateral mid abdominal  enterostomies. Status post RIGHT hemicolectomy. To the RIGHT of the staple line at the mid transverse colon, abscess and small loculated gas appears smaller, measuring 1.8 x 6.9 centimeters. Persistent significant colonic diverticulosis. The sigmoid colon is thickened and there is stranding surrounding the sigmoid colon. Small locule of gas is identified anterior to the sigmoid colon on image 67 of series 3, consistent with micro perforation and appearing smaller compared with the prior study. Persistent thickening of the mesentery in the central abdomen without development of interval abscess. Vascular/Lymphatic: There is atherosclerotic calcification of the abdominal aorta not associated with aneurysm. No retroperitoneal or mesenteric adenopathy. Reproductive: Prostate is unremarkable. Other: No ascites. Anterior abdominal wall defect is stable. Musculoskeletal: Degenerative changes are seen in the lumbar spine. There is atherosclerotic calcified spot IMPRESSION: 1. Status post placement of cholecystostomy tube with decompression of the gallbladder. Trace amount of debris and air surrounding the cholecystostomy tube. 2. Decrease in size of abscess adjacent to the staple line at the mid transverse colon. Abscess now measures 1.8 x 6.9 centimeters. 3. Persistent thickening of the mesentery in the central  abdomen without development of new abscess. 4. Smaller microperforation related to sigmoid diverticulitis. 5. Bilateral intrarenal calculi. 6. Anterior abdominal wall defect. 7. Minimal RIGHT pleural thickening and RIGHT basilar atelectasis. 8. Aortic Atherosclerosis (ICD10-I70.0). Electronically Signed   By: Nolon Nations M.D.   On: 12/24/2018 16:58     Medications:    . morphine  3 mg Intravenous Once     Assessment/ Plan:  68 y.o. male with a PMHx of asthma, nicotine abuse, hypertension, BPH, morbid obesity, peripheral neuropathy, chronic pain syndrome, recurrent diverticulitis with colonic  perforation status post right hemicolectomy and colostomy/ileostomy placement, cholecystectomy tube placement, chronic kidney disease stage III with acute kidney injury, who was admitted to [facility]2018 for ongoing treatment of generalized debility, acute renal failure, malnutrition.    1.  Acute renal failure/chronic kidney disease stage IIIA baseline EGFR 51.  Cr currently 2.33, BUN 41, continue to monitor renal parametesr.   2.  Hypernatremia.  Resolved, Na currently 341.   3.  Metabolic acidosis.  CO2 currently 33, much improved after bicarb gtt.   4.  Hypokalemia.  Potassium slightly up to 3.4, will monitor.   LOS: 0 Braylea Brancato 11/6/202011:28 AM

## 2018-12-25 NOTE — Consult Note (Signed)
Ref: Default, Provider, MD   Subjective:  Several episodes of palpitations from SVTs. Today he has one episode of wide complex tachycardia. His LV function was normal 3 months ago. He denies chest pain. His potassium and magnesium levels are on low. He is on very small dose of metoprolol. EKG today shows sinus tachycardia with 3 beat VT.  Objective:  Vital Signs in the last 24 hours:    Physical Exam: BP Readings from Last 1 Encounters:  12/09/18 126/70     Wt Readings from Last 1 Encounters:  No data found for Wt    Weight change:  There is no height or weight on file to calculate BMI. HEENT: National/AT, Eyes-Blue, PERL, EOMI, Conjunctiva-Pale pink, Sclera-Non-icteric Neck: No JVD, No bruit, Trachea midline. Lungs:  Clear, Bilateral. Cardiac:  Regular rhythm, normal S1 and S2, no S3. II/VI systolic murmur. Abdomen:  Soft, tender. BS present. Large abdominal wound. Extremities:  Trace edema present. No cyanosis. No clubbing. CNS: AxOx2, Cranial nerves grossly intact, moves all 4 extremities.  Skin: Warm and dry.   Intake/Output from previous day: No intake/output data recorded.    Lab Results: BMET    Component Value Date/Time   NA 135 12/25/2018 1008   NA 138 12/23/2018 0448   NA 138 12/18/2018 0503   K 3.4 (L) 12/25/2018 1008   K 3.3 (L) 12/23/2018 0448   K 3.7 12/20/2018 1419   CL 85 (L) 12/25/2018 1008   CL 86 (L) 12/23/2018 0448   CL 103 12/18/2018 0503   CO2 33 (H) 12/25/2018 1008   CO2 38 (H) 12/23/2018 0448   CO2 23 12/18/2018 0503   GLUCOSE 161 (H) 12/25/2018 1008   GLUCOSE 85 12/23/2018 0448   GLUCOSE 89 12/18/2018 0503   BUN 41 (H) 12/25/2018 1008   BUN 31 (H) 12/23/2018 0448   BUN 26 (H) 12/18/2018 0503   CREATININE 2.33 (H) 12/25/2018 1008   CREATININE 2.07 (H) 12/23/2018 0448   CREATININE 2.49 (H) 12/18/2018 0503   CALCIUM 8.4 (L) 12/25/2018 1008   CALCIUM 8.6 (L) 12/23/2018 0448   CALCIUM 8.9 12/18/2018 0503   GFRNONAA 28 (L) 12/25/2018 1008   GFRNONAA 32 (L) 12/23/2018 0448   GFRNONAA 26 (L) 12/18/2018 0503   GFRAA 33 (L) 12/25/2018 1008   GFRAA 38 (L) 12/23/2018 0448   GFRAA 30 (L) 12/18/2018 0503   CBC    Component Value Date/Time   WBC 8.6 12/23/2018 0448   RBC 3.45 (L) 12/23/2018 0448   HGB 9.0 (L) 12/23/2018 0448   HCT 30.3 (L) 12/23/2018 0448   PLT 361 12/23/2018 0448   MCV 87.8 12/23/2018 0448   MCH 26.1 12/23/2018 0448   MCHC 29.7 (L) 12/23/2018 0448   RDW 18.2 (H) 12/23/2018 0448   LYMPHSABS 0.9 11/27/2018 0555   MONOABS 0.7 11/27/2018 0555   EOSABS 0.3 11/27/2018 0555   BASOSABS 0.0 11/27/2018 0555   HEPATIC Function Panel Recent Labs    12/08/18 0750 12/10/18 0704 12/23/18 0448  PROT 7.6 7.1 6.4*   HEMOGLOBIN A1C No components found for: HGA1C,  MPG CARDIAC ENZYMES Lab Results  Component Value Date   CKTOTAL 26 (L) 12/08/2018   CKMB 0.8 12/08/2018   BNP No results for input(s): PROBNP in the last 8760 hours. TSH Recent Labs    12/08/18 0750  TSH 14.703*   CHOLESTEROL No results for input(s): CHOL in the last 8760 hours.  Scheduled Meds: . morphine  3 mg Intravenous Once   Continuous Infusions: PRN  Meds:.  Assessment/Plan: Wide complex tachycardia SVTs COPD Asthma CKD, III Tobacco use disorder S/P right hemicolectomy with open abdominal wound. S/P percutaneous chole drain Hypokalemia Hypomagnesemia  Increase B-blocker dose. Supplement potassium and magnesium Consider amiodarone if not improving.   LOS: 0 days   Time spent including chart review, lab review, examination, discussion with patient, referring doctor : 30 min   Orpah Cobb  MD  12/25/2018, 11:23 AM

## 2018-12-27 LAB — CBC
HCT: 32.4 % — ABNORMAL LOW (ref 39.0–52.0)
Hemoglobin: 9.5 g/dL — ABNORMAL LOW (ref 13.0–17.0)
MCH: 26 pg (ref 26.0–34.0)
MCHC: 29.3 g/dL — ABNORMAL LOW (ref 30.0–36.0)
MCV: 88.5 fL (ref 80.0–100.0)
Platelets: 409 10*3/uL — ABNORMAL HIGH (ref 150–400)
RBC: 3.66 MIL/uL — ABNORMAL LOW (ref 4.22–5.81)
RDW: 18.4 % — ABNORMAL HIGH (ref 11.5–15.5)
WBC: 12 10*3/uL — ABNORMAL HIGH (ref 4.0–10.5)
nRBC: 0 % (ref 0.0–0.2)

## 2018-12-27 LAB — BASIC METABOLIC PANEL
Anion gap: 16 — ABNORMAL HIGH (ref 5–15)
BUN: 47 mg/dL — ABNORMAL HIGH (ref 8–23)
CO2: 30 mmol/L (ref 22–32)
Calcium: 9 mg/dL (ref 8.9–10.3)
Chloride: 88 mmol/L — ABNORMAL LOW (ref 98–111)
Creatinine, Ser: 2.36 mg/dL — ABNORMAL HIGH (ref 0.61–1.24)
GFR calc Af Amer: 32 mL/min — ABNORMAL LOW (ref >60)
GFR calc non Af Amer: 28 mL/min — ABNORMAL LOW (ref >60)
Glucose, Bld: 93 mg/dL (ref 70–99)
Potassium: 4.5 mmol/L (ref 3.5–5.1)
Sodium: 134 mmol/L — ABNORMAL LOW (ref 135–145)

## 2018-12-27 LAB — MAGNESIUM: Magnesium: 2.1 mg/dL (ref 1.7–2.4)

## 2018-12-28 LAB — BASIC METABOLIC PANEL
Anion gap: 14 (ref 5–15)
BUN: 49 mg/dL — ABNORMAL HIGH (ref 8–23)
CO2: 28 mmol/L (ref 22–32)
Calcium: 8.7 mg/dL — ABNORMAL LOW (ref 8.9–10.3)
Chloride: 93 mmol/L — ABNORMAL LOW (ref 98–111)
Creatinine, Ser: 2.54 mg/dL — ABNORMAL HIGH (ref 0.61–1.24)
GFR calc Af Amer: 29 mL/min — ABNORMAL LOW (ref >60)
GFR calc non Af Amer: 25 mL/min — ABNORMAL LOW (ref >60)
Glucose, Bld: 117 mg/dL — ABNORMAL HIGH (ref 70–99)
Potassium: 4.6 mmol/L (ref 3.5–5.1)
Sodium: 135 mmol/L (ref 135–145)

## 2018-12-28 NOTE — Progress Notes (Signed)
[facility]  ROUNDING NOTE   Subjective:  Patient still has evidence for acute renal failure. BUN up to 49 with a creatinine of 2.54. He is currently back on 0.9 normal saline at 100 cc/h.   Objective:  Vital signs in last 24 hours:  Pulse 105 respirations 15 blood pressure 130/70  Physical Exam: General: No acute distress  Head: Normocephalic, atraumatic. Moist oral mucosal membranes  Eyes: Anicteric  Neck: Supple, trachea midline  Lungs:  Clear to auscultation, normal effort  Heart: S1S2 no rubs  Abdomen:  Soft, nontender, ileostomy and cholecystostomy tube present  Extremities: No peripheral edema.  Neurologic: Awake, alert, following commands  Skin: No lesions       Basic Metabolic Panel: Recent Labs  Lab 12/23/18 0448 12/25/18 1008 12/27/18 0504 12/28/18 0539  NA 138 135 134* 135  K 3.3* 3.4* 4.5 4.6  CL 86* 85* 88* 93*  CO2 38* 33* 30 28  GLUCOSE 85 161* 93 117*  BUN 31* 41* 47* 49*  CREATININE 2.07* 2.33* 2.36* 2.54*  CALCIUM 8.6* 8.4* 9.0 8.7*  MG  --  1.4* 2.1  --     Liver Function Tests: Recent Labs  Lab 12/23/18 0448  AST 19  ALT 13  ALKPHOS 86  BILITOT 0.3  PROT 6.4*  ALBUMIN 1.7*   No results for input(s): LIPASE, AMYLASE in the last 168 hours. No results for input(s): AMMONIA in the last 168 hours.  CBC: Recent Labs  Lab 12/23/18 0448 12/27/18 0504  WBC 8.6 12.0*  HGB 9.0* 9.5*  HCT 30.3* 32.4*  MCV 87.8 88.5  PLT 361 409*    Cardiac Enzymes: No results for input(s): CKTOTAL, CKMB, CKMBINDEX, TROPONINI in the last 168 hours.  BNP: Invalid input(s): POCBNP  CBG: No results for input(s): GLUCAP in the last 168 hours.  Microbiology: Results for orders placed or performed during the hospital encounter of 11/26/18  SARS CORONAVIRUS 2 (TAT 6-24 HRS) Nasopharyngeal Nasopharyngeal Swab     Status: None   Collection Time: 12/08/18 10:05 PM   Specimen: Nasopharyngeal Swab  Result Value Ref Range Status   SARS  Coronavirus 2 NEGATIVE NEGATIVE Final    Comment: (NOTE) SARS-CoV-2 target nucleic acids are NOT DETECTED. The SARS-CoV-2 RNA is generally detectable in upper and lower respiratory specimens during the acute phase of infection. Negative results do not preclude SARS-CoV-2 infection, do not rule out co-infections with other pathogens, and should not be used as the sole basis for treatment or other patient management decisions. Negative results must be combined with clinical observations, patient history, and epidemiological information. The expected result is Negative. Fact Sheet for Patients: SugarRoll.be Fact Sheet for Healthcare Providers: https://www.woods-mathews.com/ This test is not yet approved or cleared by the [place] FDA and  has been authorized for detection and/or diagnosis of SARS-CoV-2 by FDA under an Emergency Use Authorization (EUA). This EUA will remain  in effect (meaning this test can be used) for the duration of the COVID-19 declaration under Section 56 4(b)(1) of the Act, 21 U.S.C. section 360bbb-3(b)(1), unless the authorization is terminated or revoked sooner. Performed at [facility], [place] [address] [place], [place] [address]   Culture, body fluid-bottle     Status: Abnormal   Collection Time: 12/09/18 11:53 AM   Specimen: BILE  Result Value Ref Range Status   Specimen Description BILE  Final   Special Requests NONE  Final   Gram Stain   Final    IN BOTH AEROBIC  AND ANAEROBIC BOTTLES GRAM POSITIVE COCCI Performed at [facility], [place] [address], [place], [place] [address]    Culture ENTEROCOCCUS FAECIUM (A)  Final   Report Status 12/12/2018 FINAL  Final   Organism ID, Bacteria ENTEROCOCCUS FAECIUM  Final      Susceptibility   Enterococcus faecium - MIC*    AMPICILLIN >=32 RESISTANT Resistant     VANCOMYCIN 1 SENSITIVE Sensitive     GENTAMICIN SYNERGY SENSITIVE Sensitive     * ENTEROCOCCUS  FAECIUM  Gram stain     Status: None   Collection Time: 12/09/18 11:53 AM   Specimen: BILE  Result Value Ref Range Status   Specimen Description BILE  Final   Special Requests NONE  Final   Gram Stain   Final    RARE WBC PRESENT, PREDOMINANTLY MONONUCLEAR MODERATE GRAM POSITIVE COCCI IN PAIRS IN CHAINS Performed at [facility], [place] [address] [place], [place] [address]    Report Status 12/09/2018 FINAL  Final    Coagulation Studies: No results for input(s): LABPROT, INR in the last 72 hours.  Urinalysis: No results for input(s): COLORURINE, LABSPEC, PHURINE, GLUCOSEU, HGBUR, BILIRUBINUR, KETONESUR, PROTEINUR, UROBILINOGEN, NITRITE, LEUKOCYTESUR in the last 72 hours.  Invalid input(s): APPERANCEUR    Imaging: No results found.   Medications:    . morphine  3 mg Intravenous Once     Assessment/ Plan:  68 y.o. male with a PMHx of asthma, nicotine abuse, hypertension, BPH, morbid obesity, peripheral neuropathy, chronic pain syndrome, recurrent diverticulitis with colonic perforation status post right hemicolectomy and colostomy/ileostomy placement, cholecystectomy tube placement, chronic kidney disease stage III with acute kidney injury, who was admitted [facility] for ongoing treatment of generalized debility, acute renal failure, malnutrition.    1.  Acute renal failure/chronic kidney disease stage IIIA baseline EGFR 51.  Renal function a bit worse today with a BUN of 49 and creatinine of 2.54.  He is back on IV fluid hydration with 0.9 normal saline at 100 cc/h.  Continue to monitor renal parameters.  2.  Hypernatremia.  Serum sodium currently 135 and acceptable.  3.  Metabolic acidosis.  Serum bicarbonate slightly lower at 28.  Continue to monitor.  4.  Hypokalemia.  Now resolved, potassium 4.6.  LOS: 0 Brent Newman 11/9/20208:19 AM

## 2018-12-29 LAB — SARS CORONAVIRUS 2 (TAT 6-24 HRS): SARS Coronavirus 2: NEGATIVE

## 2018-12-31 ENCOUNTER — Other Ambulatory Visit: Payer: Self-pay | Admitting: Student

## 2018-12-31 ENCOUNTER — Telehealth (HOSPITAL_COMMUNITY): Payer: Self-pay

## 2018-12-31 DIAGNOSIS — K819 Cholecystitis, unspecified: Secondary | ICD-10-CM

## 2018-12-31 NOTE — Telephone Encounter (Signed)
Called to schedule drain injection, no answer, left vm. AW 

## 2019-01-19 DEATH — deceased

## 2021-07-25 IMAGING — US US ABDOMEN LIMITED
1 series · 14 of 25 positions shown · non-contrast
Comparison: None.

CLINICAL DATA: 68-year-old male with abdominal pain and fever.

EXAM:
ULTRASOUND ABDOMEN LIMITED RIGHT UPPER QUADRANT

[Series 1: us abdomen limited · 14 of 41 slices shown]
[im 1/41]
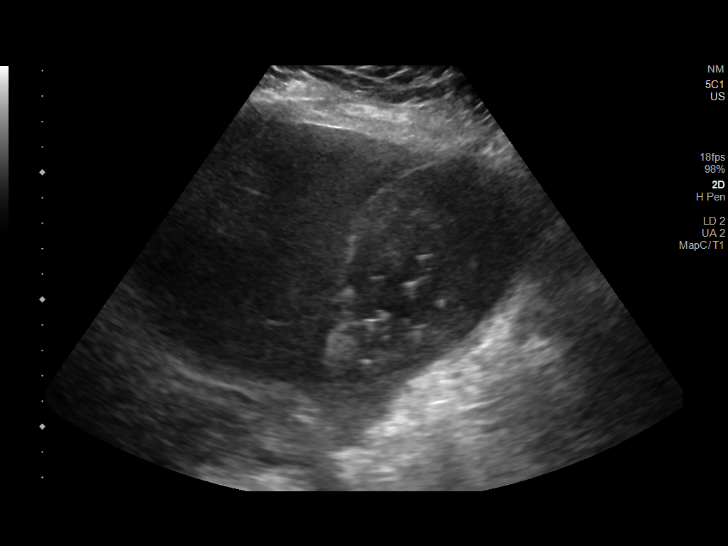
[im 4/41]
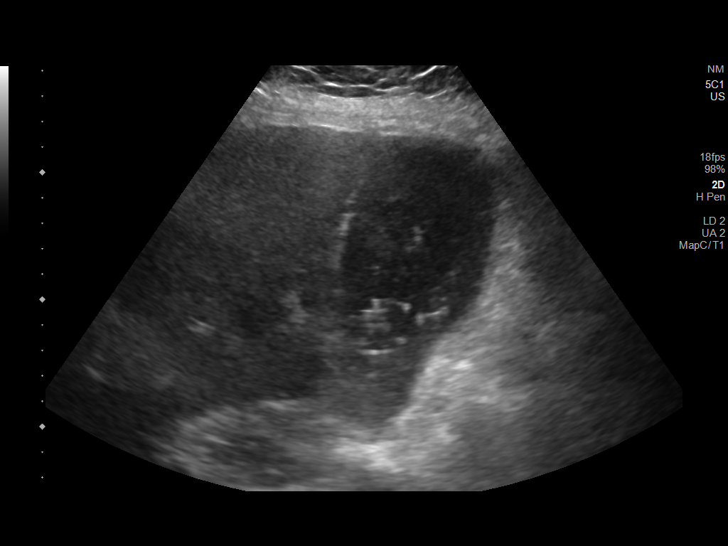
[im 7/41]
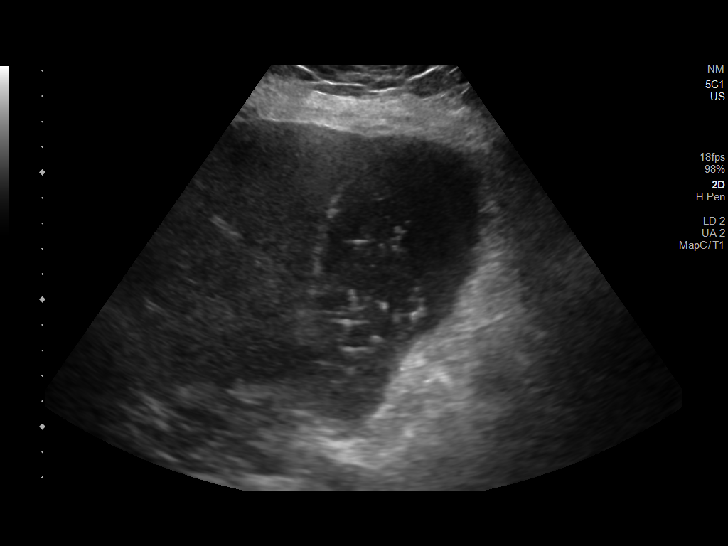
[im 11/41]
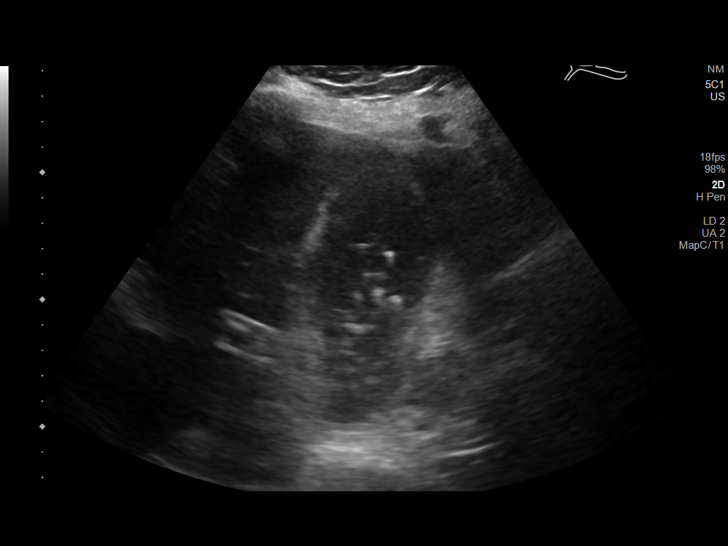
[im 14/41]
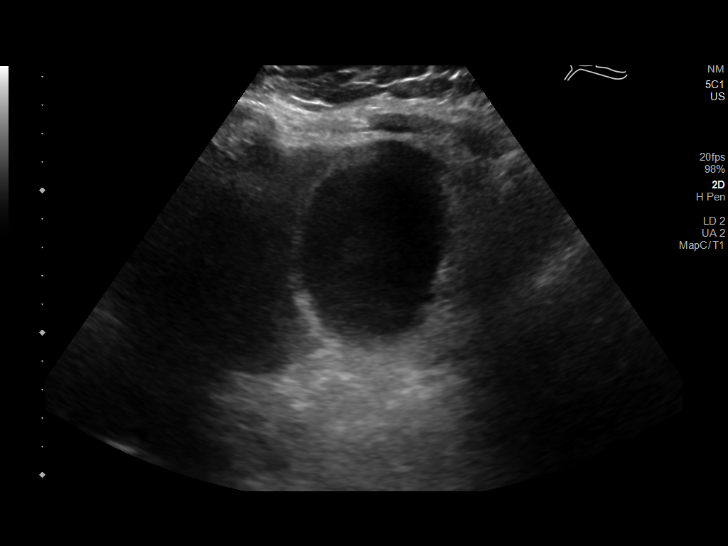
[im 16/41]
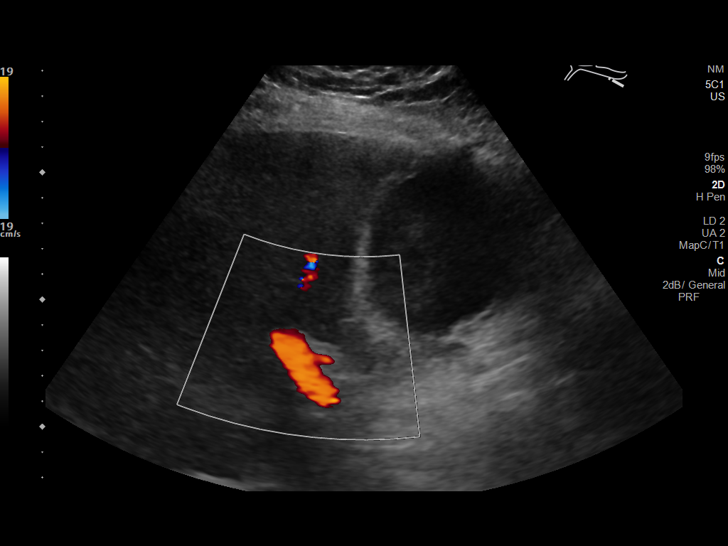
[im 19/41]
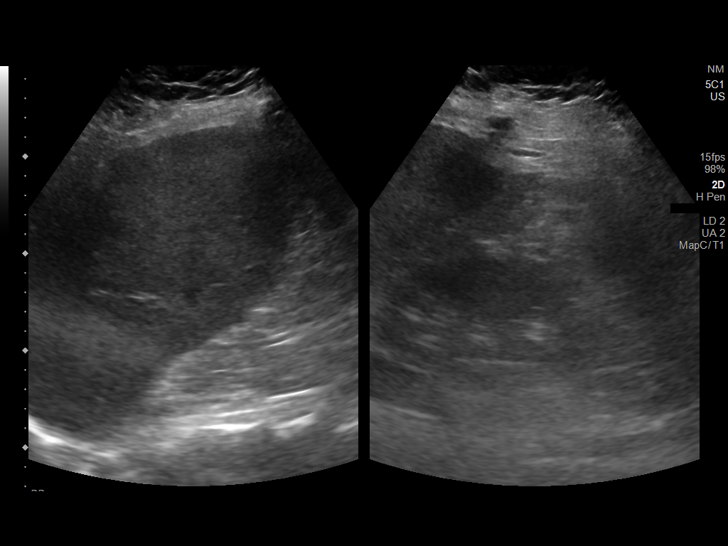
[im 22/41]
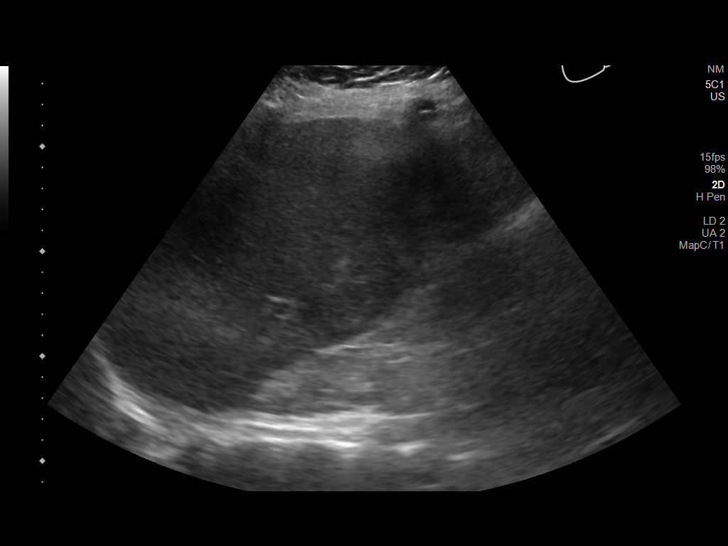
[im 26/41]
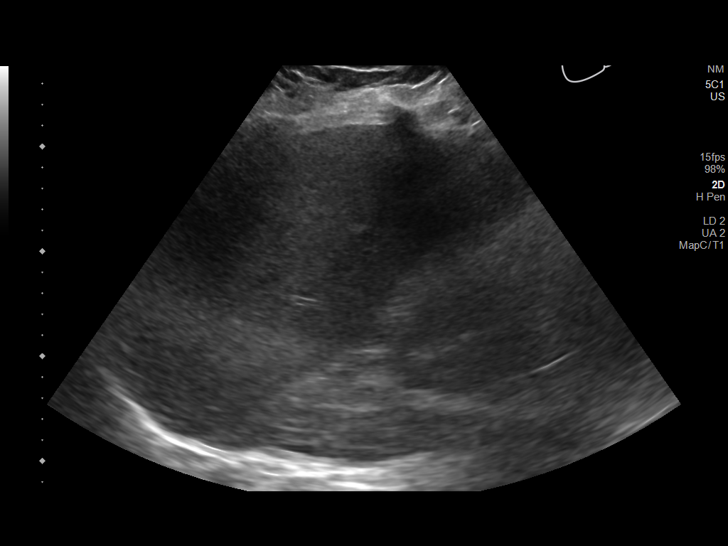
[im 27/41]
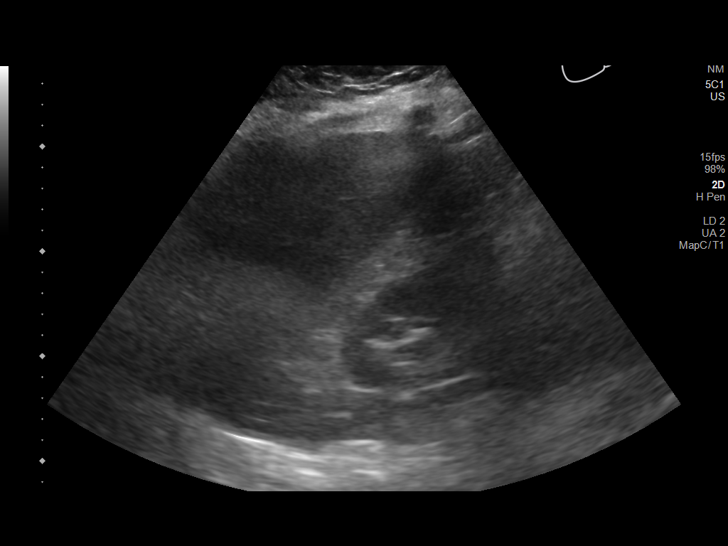
[im 31/41]
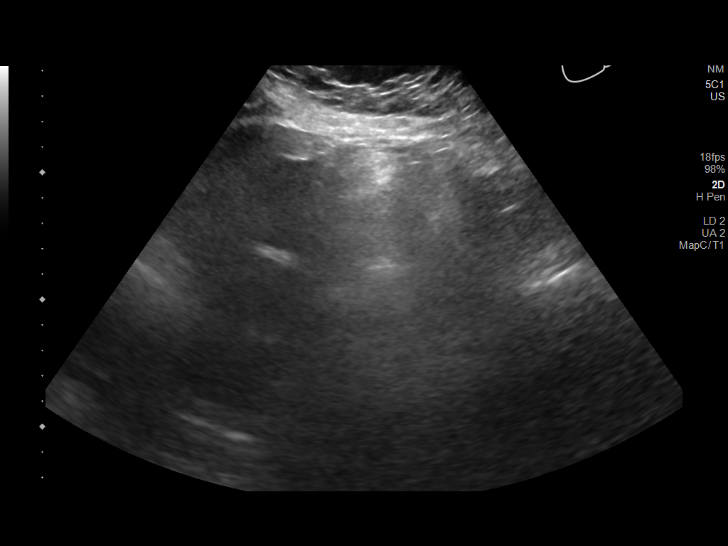
[im 34/41]
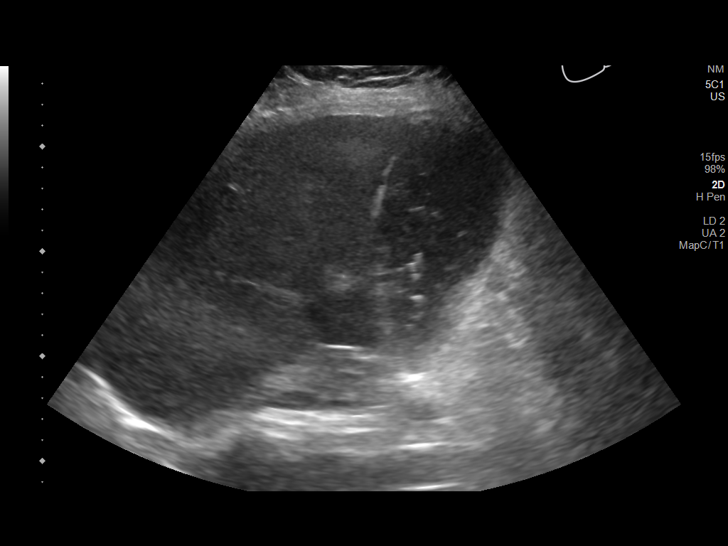
[im 37/41]
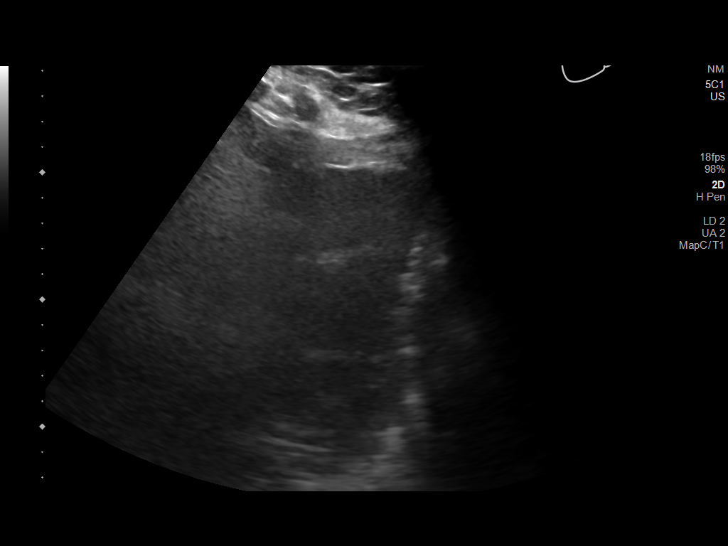
[im 41/41]
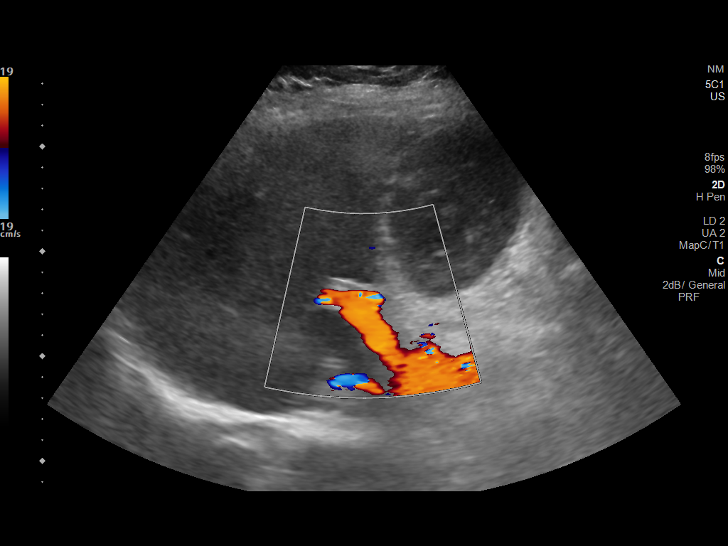

[14 of 25 positions shown; findings below may reference images not displayed]

FINDINGS: Gallbladder:

The gallbladder is distended and filled with sludge and multiple
small stones. Mild gallbladder wall thickening is noted. No definite
sonographic Murphy sign or pericholecystic fluid noted.

Common bile duct:

Diameter: 2.3 mm. No evidence of intrahepatic or extrahepatic
biliary dilatation.

Liver:

No focal lesion identified. Within normal limits in parenchymal
echogenicity. Portal vein is patent on color Doppler imaging with
normal direction of blood flow towards the liver.

Other: None.
IMPRESSION: 1. Cholelithiasis and gallbladder sludge with mild gallbladder wall
thickening, suspicious for acute cholecystitis.
2. Unremarkable liver.  No biliary dilatation.

## 2021-07-28 IMAGING — DX DG CHEST 1V PORT
1 series · 1 of 1 positions shown · non-contrast
Comparison: None.

CLINICAL DATA: Respiratory failure, shortness of breath

EXAM:
PORTABLE CHEST 1 VIEW

[chest ap]
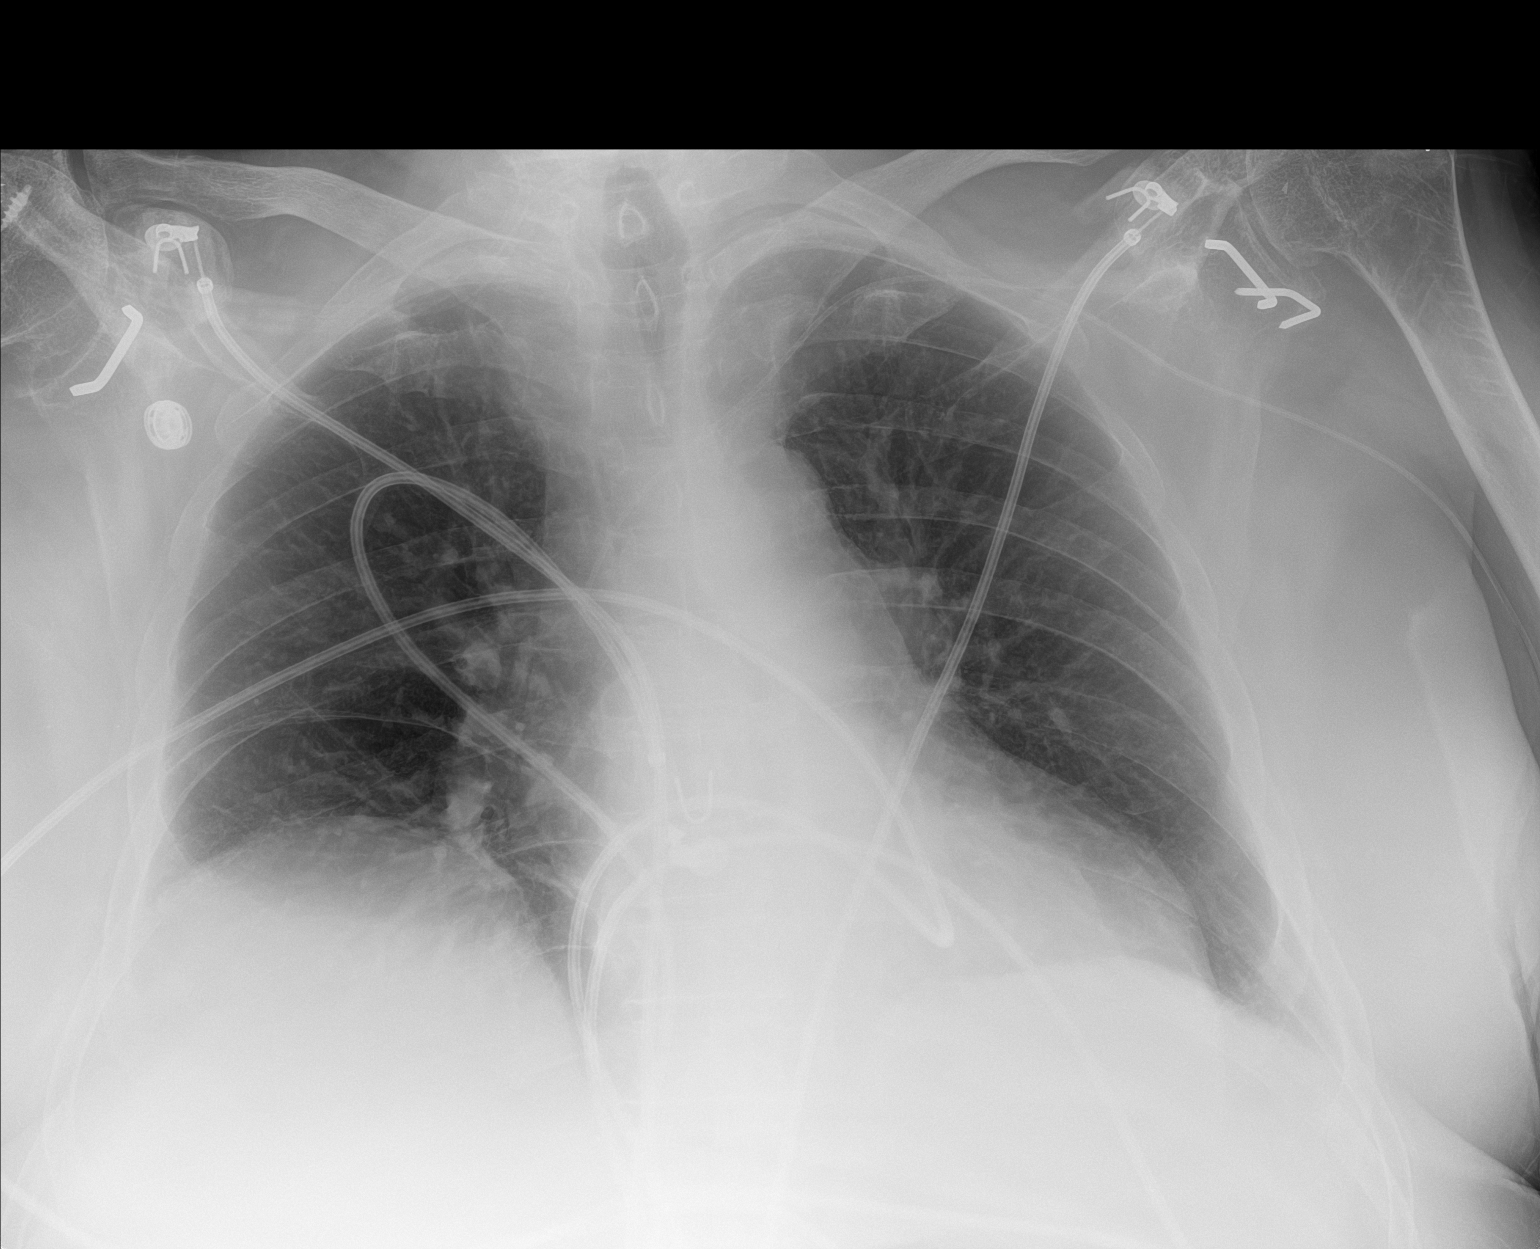

[1 of 1 positions shown; findings below may reference images not displayed]

FINDINGS: Cardiomegaly. There may be small, layering pleural effusions. The
visualized skeletal structures are unremarkable.
IMPRESSION: 1.  Cardiomegaly.

2. There may be small, layering pleural effusions. PA and lateral
radiographs may be helpful to further evaluate.
